# Patient Record
Sex: Male | Born: 1975 | Race: White | Hispanic: No | Marital: Single
Health system: Southern US, Community
[De-identification: ages and names within clinical notes are randomized; demographics above are authoritative.]

---

## 2012-12-31 ENCOUNTER — Ambulatory Visit: Payer: Self-pay | Admitting: Emergency Medicine

## 2013-01-02 ENCOUNTER — Ambulatory Visit: Payer: Self-pay | Admitting: Family Medicine

## 2013-01-04 ENCOUNTER — Ambulatory Visit: Payer: Self-pay | Admitting: Family Medicine

## 2013-01-06 ENCOUNTER — Ambulatory Visit: Payer: Self-pay | Admitting: Physician Assistant

## 2013-01-06 LAB — WOUND CULTURE

## 2014-08-06 ENCOUNTER — Ambulatory Visit: Payer: Self-pay | Admitting: Anesthesiology

## 2014-08-06 LAB — BASIC METABOLIC PANEL
Anion Gap: 5 — ABNORMAL LOW (ref 7–16)
BUN: 12 mg/dL (ref 7–18)
CO2: 28 mmol/L (ref 21–32)
CREATININE: 0.73 mg/dL (ref 0.60–1.30)
Calcium, Total: 9.5 mg/dL (ref 8.5–10.1)
Chloride: 98 mmol/L (ref 98–107)
EGFR (Non-African Amer.): >60
GLUCOSE: 340 mg/dL — AB (ref 65–99)
Osmolality: 276 (ref 275–301)
POTASSIUM: 4.1 mmol/L (ref 3.5–5.1)
SODIUM: 131 mmol/L — AB (ref 136–145)

## 2014-08-07 ENCOUNTER — Ambulatory Visit: Payer: Self-pay | Admitting: Orthopedic Surgery

## 2015-03-28 NOTE — Op Note (Signed)
PATIENT NAME:  Keith Roach, Dontarious M MR#:  [id number] DATE OF BIRTH:  1976-11-30  DATE OF PROCEDURE:  08/07/2014  PREOPERATIVE DIAGNOSIS: Displaced left distal radius fracture.   POSTOPERATIVE DIAGNOSIS:  Displaced left distal radius fracture.   PROCEDURE: Open reduction and internal fixation left distal radius.   ANESTHESIA: General.   SURGEON: Kennedy BuckerMichael Brailee Riede, M.D.   DESCRIPTION OF PROCEDURE: The patient was brought to the operating room and after adequate general anesthesia was obtained, the left arm was prepped and draped in the usual sterile fashion. Appropriate patient identification and timeout procedures were completed; fingertrap traction was applied to the index and middle fingers. Traction over the end of the bed was 9-3/4 pounds.  The tourniquet was raised and a volar approach was made over the FCR tendon. The tendon sheath was incised and the tendon retracted radially. Deep sheath was incised and the pronator identified and detached off its radial border. The fracture was displaced posteriorly and with traction and the use of a Freer elevator the fracture could be brought anterior near anatomic position.  It quite unstable though. A Crosslock plate from [employer] was then applied to the volar surface standard width distally, short plate in length. After applying it in the appropriate position and placing a K wire, the distal screw holes were filled with a distal first technique.  After the distal smooth pegs were placed, the plate was brought down to the shaft and this gave near anatomic alignment of the distal radius restoring some volar tilt, radial inclination, and length. Two of the shaft screws were placed using non-locking screws and then two of the Crosslink screws drilling and then placing the locking screws to aid in maintenance of the plate position was performed and under mini C-arm views plate position appeared appropriate.  One of the distal smooth pegs appeared to be protruding dorsally  and was removed and that hole left empty on the ulnar side of the plate.  The traction was removed, and, under fluoroscopic exam, the fracture was stable. At this point, the tourniquet was let down. There was no significant bleeding. The wound was irrigated and closed with 3-0 Vicryl subcutaneously and 4-0 nylon for the skin. Xeroform, 4 x 4's, Webril and a volar splint were applied followed by an Ace wrap. Tourniquet time was 39 minutes at 250 mmHg.   COMPLICATIONS: There were no complications.   SPECIMEN: No specimen.   IMPLANT:  [employer] Crosslink volar locking plate.     ____________________________ Leitha SchullerMichael J. Chevis Weisensel, MD mjm:nr D: 08/07/2014 20:01:50 ET T: 08/07/2014 20:56:52 ET JOB#: [id number]  cc: Leitha SchullerMichael J. Pierra Skora, MD, <Dictator> Leitha SchullerMICHAEL J Lucresia Simic MD ELECTRONICALLY SIGNED 08/08/2014 8:09

## 2018-12-27 ENCOUNTER — Encounter: Payer: 59 | Attending: Physician Assistant | Admitting: Physician Assistant

## 2018-12-27 DIAGNOSIS — E11621 Type 2 diabetes mellitus with foot ulcer: Secondary | ICD-10-CM | POA: Diagnosis not present

## 2018-12-27 DIAGNOSIS — F17200 Nicotine dependence, unspecified, uncomplicated: Secondary | ICD-10-CM | POA: Diagnosis not present

## 2018-12-27 DIAGNOSIS — L97512 Non-pressure chronic ulcer of other part of right foot with fat layer exposed: Secondary | ICD-10-CM | POA: Insufficient documentation

## 2018-12-27 DIAGNOSIS — E1143 Type 2 diabetes mellitus with diabetic autonomic (poly)neuropathy: Secondary | ICD-10-CM | POA: Diagnosis not present

## 2018-12-27 DIAGNOSIS — I10 Essential (primary) hypertension: Secondary | ICD-10-CM | POA: Diagnosis not present

## 2018-12-28 ENCOUNTER — Other Ambulatory Visit
Admission: RE | Admit: 2018-12-28 | Discharge: 2018-12-28 | Disposition: A | Discharge status: Home / Self Care | Payer: 59 | Source: Ambulatory Visit | Attending: Physician Assistant | Admitting: Physician Assistant

## 2018-12-28 DIAGNOSIS — E11621 Type 2 diabetes mellitus with foot ulcer: Secondary | ICD-10-CM | POA: Insufficient documentation

## 2018-12-28 DIAGNOSIS — L97512 Non-pressure chronic ulcer of other part of right foot with fat layer exposed: Secondary | ICD-10-CM | POA: Diagnosis not present

## 2018-12-28 NOTE — Progress Notes (Signed)
Keith Roach, Greogory M. ([id number]) Visit Report for 12/27/2018 Abuse/Suicide Risk Screen Details Patient Name: Keith Roach, Keith M. Date of Service: 12/27/2018 2:45 PM Medical Record Number: [id number] Patient Account Number: [id number] Date of Birth/Sex: 04/23/1976 [age] y.o. Male) Treating RN: Huel CoventryWoody, Kim Primary Care Gizell Danser: SYSTEM, PCP Other Clinician: Referring Miyoko Hashimi: Referral, Self Treating Anshika Pethtel/Extender: STONE III, HOYT Weeks in Treatment: 0 Abuse/Suicide Risk Screen Items Answer ABUSE/SUICIDE RISK SCREEN: Has anyone close to you tried to hurt or harm you recentlyo No Do you feel uncomfortable with anyone in your familyo No Has anyone forced you do things that you didnot want to doo No Do you have any thoughts of harming yourselfo No Patient displays signs or symptoms of abuse and/or neglect. No Electronic Signature(s) Signed: 12/27/2018 5:43:44 PM By: Elliot GurneyWoody, BSN, RN, CWS, Kim RN, BSN Entered By: Elliot GurneyWoody, BSN, RN, CWS, Kim on 12/27/2018 15:05:40 Keith Roach, Keith M. ([id number]) -------------------------------------------------------------------------------- Activities of Daily Living Details Patient Name: Keith Roach, Keith M. Date of Service: 12/27/2018 2:45 PM Medical Record Number: [id number] Patient Account Number: [id number] Date of Birth/Sex: 04/23/1976 [age] y.o. Male) Treating RN: Huel CoventryWoody, Kim Primary Care Lenville Hibberd: SYSTEM, PCP Other Clinician: Referring Kerina Simoneau: Referral, Self Treating Cinch Ormond/Extender: STONE III, HOYT Weeks in Treatment: 0 Activities of Daily Living Items Answer Activities of Daily Living (Please select one for each item) Drive Automobile Completely Able Take Medications Completely Able Use Telephone Completely Able Care for Appearance Completely Able Use Toilet Completely Able Bath / Shower Completely Able Dress Self Completely Able Feed Self Completely Able Walk Completely Able Get In / Out Bed Completely Able Housework Completely Able Prepare Meals  Completely Able Handle Money Completely Able Shop for Self Completely Able Electronic Signature(s) Signed: 12/27/2018 5:43:44 PM By: Elliot GurneyWoody, BSN, RN, CWS, Kim RN, BSN Entered By: Elliot GurneyWoody, BSN, RN, CWS, Kim on 12/27/2018 15:05:50 Keith Roach, Keith M. ([id number]) -------------------------------------------------------------------------------- Education Assessment Details Patient Name: Keith Roach, Keith M. Date of Service: 12/27/2018 2:45 PM Medical Record Number: [id number] Patient Account Number: [id number] Date of Birth/Sex: 04/23/1976 [age] y.o. Male) Treating RN: Huel CoventryWoody, Kim Primary Care Maliek Schellhorn: SYSTEM, PCP Other Clinician: Referring Dezeray Puccio: Referral, Self Treating Mande Auvil/Extender: STONE III, HOYT Weeks in Treatment: 0 Learning Preferences/Education Level/Primary Language Learning Preference: Explanation, Demonstration Highest Education Level: Grade School Preferred Language: English Cognitive Barrier Assessment/Beliefs Language Barrier: No Translator Needed: No Memory Deficit: No Emotional Barrier: No Cultural/Religious Beliefs Affecting Medical Care: No Physical Barrier Assessment Impaired Vision: No Impaired Hearing: No Decreased Hand dexterity: No Knowledge/Comprehension Assessment Knowledge Level: High Comprehension Level: High Ability to understand written High instructions: Ability to understand verbal High instructions: Motivation Assessment Anxiety Level: Calm Cooperation: Cooperative Education Importance: Acknowledges Need Interest in Health Problems: Asks Questions Perception: Coherent Willingness to Engage in Self- High Management Activities: Readiness to Engage in Self- High Management Activities: Electronic Signature(s) Signed: 12/27/2018 5:43:44 PM By: Elliot GurneyWoody, BSN, RN, CWS, Kim RN, BSN Entered By: Elliot GurneyWoody, BSN, RN, CWS, Kim on 12/27/2018 15:06:15 Keith Roach, Keith M.  ([id number]) -------------------------------------------------------------------------------- Fall Risk Assessment Details Patient Name: Keith Roach, [name] Date of Service: 12/27/2018 2:45 PM Medical Record Number: [id number] Patient Account Number: [id number] Date of Birth/Sex: 04/23/1976 [age] y.o. Male) Treating RN: Huel CoventryWoody, Kim Primary Care Ura Hausen: SYSTEM, PCP Other Clinician: Referring Jago Carton: Referral, Self Treating Jackey Housey/Extender: STONE III, HOYT Weeks in Treatment: 0 Fall Risk Assessment Items Have you had 2 or more falls in the last 12 monthso 0 No Have you had any fall that resulted in injury in the last 12 monthso 0 No FALL RISK ASSESSMENT: History of falling - immediate or  within 3 months 0 No Secondary diagnosis 0 No Ambulatory aid None/bed rest/wheelchair/nurse 0 Yes Crutches/cane/walker 0 No Furniture 0 No IV Access/Saline Lock 0 No Gait/Training Normal/bed rest/immobile 0 Yes Weak 0 No Impaired 0 No Mental Status Oriented to own ability 0 Yes Electronic Signature(s) Signed: 12/27/2018 5:43:44 PM By: Elliot Gurney, BSN, RN, CWS, Kim RN, BSN Entered By: Elliot Gurney, BSN, RN, CWS, Kim on 12/27/2018 15:07:51 Keith Roach ([id number]) -------------------------------------------------------------------------------- Foot Assessment Details Patient Name: Keith Roach. Date of Service: 12/27/2018 2:45 PM Medical Record Number: [id number] Patient Account Number: [id number] Date of Birth/Sex: 12/18/1975 (43 y.o. Male) Treating RN: Huel Coventry Primary Care Jozey Janco: SYSTEM, PCP Other Clinician: Referring Vennessa Affinito: Referral, Self Treating Carolle Ishii/Extender: STONE III, HOYT Weeks in Treatment: 0 Foot Assessment Items Site Locations + = Sensation present, - = Sensation absent, C = Callus, U = Ulcer R = Redness, W = Warmth, M = Maceration, PU = Pre-ulcerative lesion F = Fissure, S = Swelling, D = Dryness Assessment Right: Left: Other Deformity: No No Prior Foot Ulcer: No No Prior  Amputation: No No Charcot Joint: No No Ambulatory Status: Ambulatory Without Help Gait: Steady Electronic Signature(s) Signed: 12/27/2018 5:43:44 PM By: Elliot Gurney, BSN, RN, CWS, Kim RN, BSN Entered By: Elliot Gurney, BSN, RN, CWS, Kim on 12/27/2018 15:08:31 Keith Roach ([id number]) -------------------------------------------------------------------------------- Nutrition Risk Assessment Details Patient Name: Keith Roach. Date of Service: 12/27/2018 2:45 PM Medical Record Number: [id number] Patient Account Number: [id number] Date of Birth/Sex: 1976/10/23 (43 y.o. Male) Treating RN: Huel Coventry Primary Care Amery Vandenbos: SYSTEM, PCP Other Clinician: Referring Lashone Stauber: Referral, Self Treating Alexus Michael/Extender: STONE III, HOYT Weeks in Treatment: 0 Height (in): 76 Weight (lbs): 300.4 Body Mass Index (BMI): 36.6 Nutrition Risk Assessment Items NUTRITION RISK SCREEN: I have an illness or condition that made me change the kind and/or amount of 0 No food I eat I eat fewer than two meals per day 0 No I eat few fruits and vegetables, or milk products 0 No I have three or more drinks of beer, liquor or wine almost every day 0 No I have tooth or mouth problems that make it hard for me to eat 0 No I don't always have enough money to buy the food I need 0 No I eat alone most of the time 0 No I take three or more different prescribed or over-the-counter drugs a day 1 Yes Without wanting to, I have lost or gained 10 pounds in the last six months 0 No I am not always physically able to shop, cook and/or feed myself 0 No Nutrition Protocols Good Risk Protocol 0 No interventions needed Moderate Risk Protocol Electronic Signature(s) Signed: 12/27/2018 5:43:44 PM By: Elliot Gurney, BSN, RN, CWS, Kim RN, BSN Entered By: Elliot Gurney, BSN, RN, CWS, Kim on 12/27/2018 15:07:55

## 2018-12-30 NOTE — Progress Notes (Signed)
[name], [name] ([id number]) Visit Report for 12/27/2018 Allergy List Details Patient Name: Keith Roach, Keith Roach. Date of Service: 12/27/2018 2:45 PM Medical Record Number: [id number] Patient Account Number: [id number] Date of Birth/Sex: [birth year] (43 y.o. Male) Treating RN: Cornell Barman Primary Care Marlowe Cinquemani: SYSTEM, PCP Other Clinician: Referring Mohanad Carsten: Referral, Self Treating Ryot Burrous/Extender: STONE III, HOYT Weeks in Treatment: 0 Allergies Active Allergies No Known Drug Allergies Allergy Notes Electronic Signature(s) Signed: 12/27/2018 5:43:44 PM By: Gretta Cool, BSN, RN, CWS, Kim RN, BSN Entered By: Gretta Cool, BSN, RN, CWS, Kim on 12/27/2018 14:56:52 Keith Roach ([id number]) -------------------------------------------------------------------------------- Arrival Information Details Patient Name: Keith Roach. Date of Service: 12/27/2018 2:45 PM Medical Record Number: [id number] Patient Account Number: [id number] Date of Birth/Sex: [birth year] (43 y.o. Male) Treating RN: Montey Hora Primary Care Siddiq Kaluzny: SYSTEM, PCP Other Clinician: Referring Bethania Schlotzhauer: Referral, Self Treating Shafer Swamy/Extender: STONE III, HOYT Weeks in Treatment: 0 Visit Information Patient Arrived: Ambulatory Arrival Time: 14:51 Accompanied By: self Transfer Assistance: None Patient Identification Verified: Yes Secondary Verification Process Completed: Yes Electronic Signature(s) Signed: 12/27/2018 4:32:03 PM By: Lorine Bears RCP, RRT, CHT Entered By: Lorine Bears on 12/27/2018 14:51:24 Keith Roach ([id number]) -------------------------------------------------------------------------------- Clinic Level of Care Assessment Details Patient Name: Keith Roach, Keith Roach. Date of Service: 12/27/2018 2:45 PM Medical Record Number: [id number] Patient Account Number: [id number] Date of Birth/Sex: [birth year] (43 y.o. Male) Treating RN: Montey Hora Primary Care Sojourner Behringer: SYSTEM, PCP Other  Clinician: Referring Cruz Bong: Referral, Self Treating Adelynne Joerger/Extender: STONE III, HOYT Weeks in Treatment: 0 Clinic Level of Care Assessment Items TOOL 1 Quantity Score _0  - Use when EandM and Procedure is performed on INITIAL visit 0 ASSESSMENTS - Nursing Assessment / Reassessment X - General Physical Exam (combine w/ comprehensive assessment (listed just below) when 1 20 performed on new pt. evals) X- 1 25 Comprehensive Assessment (HX, ROS, Risk Assessments, Wounds Hx, etc.) ASSESSMENTS - Wound and Skin Assessment / Reassessment _1  - Dermatologic / Skin Assessment (not related to wound area) 0 ASSESSMENTS - Ostomy and/or Continence Assessment and Care _2  - Incontinence Assessment and Management 0 _3  - 0 Ostomy Care Assessment and Management (repouching, etc.) PROCESS - Coordination of Care X - Simple Patient / Family Education for ongoing care 1 15 _4  - 0 Complex (extensive) Patient / Family Education for ongoing care X- 1 10 Staff obtains Programmer, systems, Records, Test Results / Process Orders _5  - 0 Staff telephones HHA, Nursing Homes / Clarify orders / etc _6  - 0 Routine Transfer to another Facility (non-emergent condition) _7  - 0 Routine Hospital Admission (non-emergent condition) X- 1 15 New Admissions / Biomedical engineer / Ordering NPWT, Apligraf, etc. _8  - 0 Emergency Hospital Admission (emergent condition) PROCESS - Special Needs _9  - Pediatric / Minor Patient Management 0 _10  - 0 Isolation Patient Management _11  - 0 Hearing / Language / Visual special needs _12  - 0 Assessment of Community assistance (transportation, D/C planning, etc.) _13  - 0 Additional assistance / Altered mentation _14  - 0 Support Surface(s) Assessment (bed, cushion, seat, etc.) Keith Roach, Keith Roach ([id number]) INTERVENTIONS - Miscellaneous _15  - External ear exam 0 _16  - 0 Patient Transfer (multiple staff / Civil Service fast streamer / Similar devices) _17  - 0 Simple Staple / Suture removal (25 or less) _18   - 0 Complex Staple / Suture removal (26 or more) _19  - 0 Hypo/Hyperglycemic Management (do not check if billed separately) X- 1 15 Ankle / Brachial Index (ABI) - do not check if billed separately Has the patient been seen at the hospital within the last  three years: Yes Total Score: 100 Level Of Care: New/Established - Level 3 Electronic Signature(s) Signed: 12/27/2018 5:09:04 PM By: Montey Hora Entered By: Montey Hora on 12/27/2018 15:55:46 Keith Roach ([id number]) -------------------------------------------------------------------------------- Encounter Discharge Information Details Patient Name: Keith Roach. Date of Service: 12/27/2018 2:45 PM Medical Record Number: [id number] Patient Account Number: [id number] Date of Birth/Sex: 15-May-1976 (43 y.o. Male) Treating RN: Montey Hora Primary Care Dainel Arcidiacono: SYSTEM, PCP Other Clinician: Referring Madeline Pho: Referral, Self Treating Kymoni Lesperance/Extender: STONE III, HOYT Weeks in Treatment: 0 Encounter Discharge Information Items Post Procedure Vitals Discharge Condition: Stable Temperature (F): 98.1 Ambulatory Status: Ambulatory Pulse (bpm): 89 Discharge Destination: Home Respiratory Rate (breaths/min): 16 Transportation: Private Auto Blood Pressure (mmHg): 191/82 Accompanied By: self Schedule Follow-up Appointment: Yes Clinical Summary of Care: Electronic Signature(s) Signed: 12/27/2018 4:56:17 PM By: Montey Hora Entered By: Montey Hora on 12/27/2018 16:56:17 Keith Roach ([id number]) -------------------------------------------------------------------------------- Lower Extremity Assessment Details Patient Name: Keith Roach. Date of Service: 12/27/2018 2:45 PM Medical Record Number: [id number] Patient Account Number: [id number] Date of Birth/Sex: [birth year] (43 y.o. Male) Treating RN: Cornell Barman Primary Care Ahmon Tosi: SYSTEM, PCP Other Clinician: Referring Ellieana Dolecki: Referral, Self Treating  Cruzita Lipa/Extender: STONE III, HOYT Weeks in Treatment: 0 Edema Assessment Assessed: [Left: No] [Right: No] Edema: [Left: No] [Right: No] Vascular Assessment Claudication: Claudication Assessment [Left:None] [Right:None] Pulses: Dorsalis Pedis Palpable: [Left:Yes] [Right:Yes] Doppler Audible: [Left:Yes] [Right:Yes] Posterior Tibial Palpable: [Left:Yes] [Right:Yes] Doppler Audible: [Right:Yes] Extremity colors, hair growth, and conditions: Extremity Color: [Left:Normal] [Right:Normal] Hair Growth on Extremity: [Left:Yes] [Right:Yes] Temperature of Extremity: [Left:Cool] [Right:Cool] Capillary Refill: [Left:< 3 seconds] [Right:< 3 seconds] Blood Pressure: Brachial: [Right:180] Dorsalis Pedis: [Left:Dorsalis Pedis:] Ankle: Posterior Tibial: 200 [Left:Posterior Tibial: 1.11] Toe Nail Assessment Left: Right: Thick: Yes Discolored: Yes Deformed: Yes Improper Length and Hygiene: Yes Notes 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 Patient is non-compressible >220 on right side. Electronic Signature(s) Signed: 12/27/2018 5:43:44 PM By: Gretta Cool, BSN, RN, CWS, Kim RN, BSN Entered By: Gretta Cool, BSN, RN, CWS, Kim on 12/27/2018 15:18:21 Keith Roach ([id number]) -------------------------------------------------------------------------------- Multi Wound Chart Details Patient Name: Keith Roach, Keith Roach. Date of Service: 12/27/2018 2:45 PM Medical Record Number: [id number] Patient Account Number: [id number] Date of Birth/Sex: [birth year] (43 y.o. Male) Treating RN: Montey Hora Primary Care Sari Cogan: SYSTEM, PCP Other Clinician: Referring Zariana Strub: Referral, Self Treating Camren Lipsett/Extender: STONE III, HOYT Weeks in Treatment: 0 Vital Signs Height(in): 76 Pulse(bpm): 59 Weight(lbs): 300.4 Blood Pressure(mmHg): 191/82 Body Mass Index(BMI): 37 Temperature(F): 98.1 Respiratory Rate 16 (breaths/min): Photos: [1:No Photos] [N/A:N/A] Wound Location: [1:Right Toe Great]  [N/A:N/A] Wounding Event: [1:Gradually Appeared] [N/A:N/A] Primary Etiology: [1:Diabetic Wound/Ulcer of the Lower Extremity] [N/A:N/A] Comorbid History: [1:Hypertension, Type II Diabetes, Osteomyelitis, Neuropathy] [N/A:N/A] Date Acquired: [1:12/17/2018] [N/A:N/A] Weeks of Treatment: [1:0] [N/A:N/A] Wound Status: [1:Open] [N/A:N/A] Pending Amputation on [1:Yes] [N/A:N/A] Presentation: Measurements L x W x D [1:0.8x0.9x1.2] [N/A:N/A] (cm) Area (cm) : [1:0.565] [N/A:N/A] Volume (cm) : [1:0.679] [N/A:N/A] % Reduction in Area: [1:0.00%] [N/A:N/A] % Reduction in Volume: [1:0.00%] [N/A:N/A] Starting Position 1 [1:12] (o'clock): Ending Position 1 [1:12] (o'clock): Maximum Distance 1 (cm): [1:0.9] Undermining: [1:Yes] [N/A:N/A] Classification: [1:Grade 2] [N/A:N/A] Exudate Amount: [1:Medium] [N/A:N/A] Exudate Type: [1:Sanguinous] [N/A:N/A] Exudate Color: [1:red] [N/A:N/A] Wound Margin: [1:Thickened] [N/A:N/A] Granulation Amount: [1:None Present (0%)] [N/A:N/A] Necrotic Amount: [1:Large (67-100%)] [N/A:N/A] Necrotic Tissue: [1:Eschar, Adherent Slough] [N/A:N/A] Exposed Structures: [1:Fat Layer (Subcutaneous Tissue) Exposed: Yes Fascia: No Tendon: No] [N/A:N/A] Muscle: No Joint: No Bone: No Epithelialization: None N/A N/A Periwound Skin Texture: Callus: Yes N/A N/A Excoriation: No Induration: No Crepitus: No Rash:  No Scarring: No Periwound Skin Moisture: Dry/Scaly: Yes N/A N/A Maceration: No Periwound Skin Color: Atrophie Blanche: No N/A N/A Cyanosis: No Ecchymosis: No Erythema: No Hemosiderin Staining: No Mottled: No Pallor: No Rubor: No Temperature: No Abnormality N/A N/A Tenderness on Palpation: No N/A N/A Wound Preparation: Topical Anesthetic Applied: N/A N/A None Treatment Notes Electronic Signature(s) Signed: 12/27/2018 5:09:04 PM By: Montey Hora Entered By: Montey Hora on 12/27/2018 15:49:18 Keith Roach  ([id number]) -------------------------------------------------------------------------------- [place] Details Patient Name: Keith Roach. Date of Service: 12/27/2018 2:45 PM Medical Record Number: [id number] Patient Account Number: [id number] Date of Birth/Sex: 07-20-1976 (43 y.o. Male) Treating RN: Montey Hora Primary Care Dorathea Faerber: SYSTEM, PCP Other Clinician: Referring Savan Ruta: Referral, Self Treating Gerald Kuehl/Extender: STONE III, HOYT Weeks in Treatment: 0 Active Inactive Abuse / Safety / Falls / Self Care Management Nursing Diagnoses: Potential for falls Goals: Patient will remain injury free related to falls Date Initiated: 12/27/2018 Target Resolution Date: 03/09/2019 Goal Status: Active Interventions: Assess fall risk on admission and as needed Notes: Necrotic Tissue Nursing Diagnoses: Impaired tissue integrity related to necrotic/devitalized tissue Goals: Necrotic/devitalized tissue will be minimized in the wound bed Date Initiated: 12/27/2018 Target Resolution Date: 03/09/2019 Goal Status: Active Interventions: Assess patient pain level pre-, during and post procedure and prior to discharge Notes: Orientation to the Wound Care Program Nursing Diagnoses: Knowledge deficit related to the wound healing center program Goals: Patient/caregiver will verbalize understanding of the [place] Program Date Initiated: 12/27/2018 Target Resolution Date: 03/09/2019 Goal Status: Active Interventions: Provide education on orientation to the wound center [place]qu[place], Juleen China M. ([id number]) Notes: Wound/Skin Impairment Nursing Diagnoses: Impaired tissue integrity Goals: Ulcer/skin breakdown will heal within 14 weeks Date Initiated: 12/27/2018 Target Resolution Date: 03/09/2019 Goal Status: Active Interventions: Assess patient/caregiver ability to obtain necessary supplies Assess patient/caregiver ability to perform ulcer/skin care regimen upon  admission and as needed Assess ulceration(s) every visit Notes: Electronic Signature(s) Signed: 12/27/2018 5:09:04 PM By: Montey Hora Entered By: Montey Hora on 12/27/2018 15:49:08 Keith Roach ([id number]) -------------------------------------------------------------------------------- Pain Assessment Details Patient Name: Keith Roach. Date of Service: 12/27/2018 2:45 PM Medical Record Number: [id number] Patient Account Number: [id number] Date of Birth/Sex: 03/05/1976 (43 y.o. Male) Treating RN: Montey Hora Primary Care Joden Bonsall: SYSTEM, PCP Other Clinician: Referring Obi Scrima: Referral, Self Treating Shalini Mair/Extender: STONE III, HOYT Weeks in Treatment: 0 Active Problems Location of Pain Severity and Description of Pain Patient Has Paino No Site Locations Pain Management and Medication Current Pain Management: Electronic Signature(s) Signed: 12/27/2018 4:32:03 PM By: Lorine Bears RCP, RRT, CHT Signed: 12/27/2018 5:09:04 PM By: Montey Hora Entered By: Lorine Bears on 12/27/2018 14:51:35 Keith Roach ([id number]) -------------------------------------------------------------------------------- Patient/Caregiver Education Details Patient Name: Keith Roach. Date of Service: 12/27/2018 2:45 PM Medical Record Number: [id number] Patient Account Number: [id number] Date of Birth/Gender: 23-Sep-1976 (43 y.o. Male) Treating RN: Montey Hora Primary Care Physician: SYSTEM, PCP Other Clinician: Referring Physician: Referral, Self Treating Physician/Extender: Melburn Hake, HOYT Weeks in Treatment: 0 Education Assessment Education Provided To: Patient Education Topics Provided Wound/Skin Impairment: Handouts: Other: wound care as ordered Methods: Demonstration, Explain/Verbal Responses: State content correctly Electronic Signature(s) Signed: 12/27/2018 5:09:04 PM By: Montey Hora Entered By: Montey Hora on 12/27/2018  15:56:07 Keith Roach ([id number]) -------------------------------------------------------------------------------- Wound Assessment Details Patient Name: Keith Roach. Date of Service: 12/27/2018 2:45 PM Medical Record Number: [id number] Patient Account Number: [id number] Date of Birth/Sex: 1976-11-02 (43 y.o. Male) Treating RN: Cornell Barman Primary Care Tymarion Everard: SYSTEM, PCP Other Clinician: Referring Rasul Decola: Referral, Self  Treating Lauri Purdum/Extender: STONE III, HOYT Weeks in Treatment: 0 Wound Status Wound Number: 1 Primary Diabetic Wound/Ulcer of the Lower Extremity Etiology: Wound Location: Right Toe Great Wound Status: Open Wounding Event: Gradually Appeared Comorbid Hypertension, Type II Diabetes, Date Acquired: 12/17/2018 History: Osteomyelitis, Neuropathy Weeks Of Treatment: 0 Clustered Wound: No Pending Amputation On Presentation Photos Wound Measurements Length: (cm) 0.8 % Reduction i Width: (cm) 0.9 % Reduction i Depth: (cm) 1.2 Epithelializa Area: (cm) 0.565 Tunneling: Volume: (cm) 0.679 Undermining: Starting P Ending Pos Maximum Di n Area: 0% n Volume: 0% tion: None No Yes osition (o'clock): 12 ition (o'clock): 12 stance: (cm) 0.9 Wound Description Classification: Grade 2 Foul Odor Aft Wound Margin: Thickened Slough/Fibrin Exudate Amount: Medium Exudate Type: Sanguinous Exudate Color: red er Cleansing: No o Yes Wound Bed Granulation Amount: None Present (0%) Exposed Structure Necrotic Amount: Large (67-100%) Fascia Exposed: No Necrotic Quality: Eschar, Adherent Slough Fat Layer (Subcutaneous Tissue) Exposed: Yes Tendon Exposed: No Muscle Exposed: No Keith Roach, Keith Roach ([id number]) Joint Exposed: No Bone Exposed: No Periwound Skin Texture Texture Color No Abnormalities Noted: No No Abnormalities Noted: No Callus: Yes Atrophie Blanche: No Crepitus: No Cyanosis: No Excoriation: No Ecchymosis: No Induration: No Erythema: No Rash:  No Hemosiderin Staining: No Scarring: No Mottled: No Pallor: No Moisture Rubor: No No Abnormalities Noted: No Dry / Scaly: Yes Temperature / Pain Maceration: No Temperature: No Abnormality Wound Preparation Topical Anesthetic Applied: None Treatment Notes Wound #1 (Right Toe Great) Notes silvercel, foam and conform Electronic Signature(s) Signed: 12/27/2018 5:28:05 PM By: Gretta Cool, BSN, RN, CWS, Kim RN, BSN Entered By: Gretta Cool, BSN, RN, CWS, Kim on 12/27/2018 17:28:05 Keith Roach ([id number]) -------------------------------------------------------------------------------- Vitals Details Patient Name: Keith Roach. Date of Service: 12/27/2018 2:45 PM Medical Record Number: [id number] Patient Account Number: [id number] Date of Birth/Sex: August 29, 1976 (43 y.o. Male) Treating RN: Montey Hora Primary Care Solash Tullo: SYSTEM, PCP Other Clinician: Referring Kimerly Rowand: Referral, Self Treating Inice Sanluis/Extender: STONE III, HOYT Weeks in Treatment: 0 Vital Signs Time Taken: 14:51 Temperature (F): 98.1 Height (in): 76 Pulse (bpm): 89 Source: Stated Respiratory Rate (breaths/min): 16 Weight (lbs): 300.4 Blood Pressure (mmHg): 191/82 Source: Measured Reference Range: 80 - 120 mg / dl Body Mass Index (BMI): 36.6 Airway Electronic Signature(s) Signed: 12/27/2018 4:32:03 PM By: Lorine Bears RCP, RRT, CHT Entered By: Lorine Bears on 12/27/2018 14:54:40

## 2018-12-30 NOTE — Progress Notes (Signed)
CLOUD, GRAHAM ([id number]) Visit Report for 12/27/2018 Chief Complaint Document Details Patient Name: Keith Roach, Keith Roach. Date of Service: 12/27/2018 2:45 PM Medical Record Number: [id number] Patient Account Number: [id number] Date of Birth/Sex: 1976/10/23 (43 y.o. Male) Treating RN: Curtis Sites Primary Care Provider: SYSTEM, PCP Other Clinician: Referring Provider: Referral, Self Treating Provider/Extender: STONE III, HOYT Weeks in Treatment: 0 Information Obtained from: Patient Chief Complaint Right foot ulcer Electronic Signature(s) Signed: 12/29/2018 9:42:16 AM By: Lenda Kelp PA-C Entered By: Lenda Kelp on 12/27/2018 15:21:02 Keith Roach ([id number]) -------------------------------------------------------------------------------- Debridement Details Patient Name: Keith Roach. Date of Service: 12/27/2018 2:45 PM Medical Record Number: [id number] Patient Account Number: [id number] Date of Birth/Sex: Aug 27, 1976 (43 y.o. Male) Treating RN: Huel Coventry Primary Care Provider: SYSTEM, PCP Other Clinician: Referring Provider: Referral, Self Treating Provider/Extender: STONE III, HOYT Weeks in Treatment: 0 Debridement Performed for Wound #1 Right Toe Great Assessment: Performed By: Physician STONE III, HOYT E., PA-C Debridement Type: Debridement Severity of Tissue Pre Bone involvement without necrosis Debridement: Level of Consciousness (Pre- Awake and Alert procedure): Pre-procedure Verification/Time Yes - 15:50 Out Taken: Start Time: 15:50 Pain Control: Lidocaine Total Area Debrided (L x W): 0.8 (cm) x 0.9 (cm) = 0.72 (cm) Tissue and other material Viable, Non-Viable, Blood Clots, Callus, Slough, Subcutaneous, Slough debrided: Level: Skin/Subcutaneous Tissue Debridement Description: Excisional Instrument: Curette Specimen: Swab, Number of Specimens Taken: 1 Bleeding: Moderate Hemostasis Achieved: Pressure End Time: 16:01 Procedural Pain:  Insensate Post Procedural Pain: Insensate Response to Treatment: Procedure was tolerated well Level of Consciousness Awake and Alert (Post-procedure): Post Debridement Measurements of Total Wound Length: (cm) 1 Width: (cm) 0.9 Depth: (cm) 1.2 Volume: (cm) 0.848 Character of Wound/Ulcer Post Debridement: Stable Severity of Tissue Post Debridement: Bone involvement without necrosis Post Procedure Diagnosis Same as Pre-procedure Electronic Signature(s) Signed: 12/27/2018 5:43:44 PM By: Elliot Gurney, BSN, RN, CWS, Kim RN, BSN Signed: 12/29/2018 9:42:16 AM By: Lenda Kelp PA-C Entered By: Elliot Gurney, BSN, RN, CWS, Kim on 12/27/2018 16:08:20 WETZEL, MEESTER ([id number]) JAECE, DUCHARME Macclenny. ([id number]) -------------------------------------------------------------------------------- HPI Details Patient Name: Keith Roach. Date of Service: 12/27/2018 2:45 PM Medical Record Number: [id number] Patient Account Number: [id number] Date of Birth/Sex: 03-28-1976 (43 y.o. Male) Treating RN: Curtis Sites Primary Care Provider: SYSTEM, PCP Other Clinician: Referring Provider: Referral, Self Treating Provider/Extender: STONE III, HOYT Weeks in Treatment: 0 History of Present Illness HPI Description: 12/27/18 on evaluation today patient presents for initial inspection in our clinic during issues that he is having with his right great toe. 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 He is a current smoker and his blood sugar from between 150 and 170 although he does not have a recent A1c. He does have neuropathy and has no feeling in his feet. His ABI's today were noncompressible on the right 1.11 on the left. He does have a history of hypertension along with the type II diabetes. He tells me that in the beginning he felt like this was more of a blister over his toe where he had a callous. Subsequently in the past several days especially the past week it became  obvious that he was having bleeding from the site. He has not really going anywhere to have this evaluated he just been taking care of it for the most part on his own. With that being said no one has done the x-rays previously seen podiatry. No fevers, chills, nausea, or vomiting noted at this time. He does  have a significant amount of callous buildup over the right first toe. Electronic Signature(s) Signed: 12/29/2018 9:42:16 AM By: Lenda Kelp PA-C Entered By: Lenda Kelp on 12/29/2018 09:38:23 Keith Roach ([id number]) -------------------------------------------------------------------------------- Physical Exam Details Patient Name: Keith Roach. Date of Service: 12/27/2018 2:45 PM Medical Record Number: [id number] Patient Account Number: [id number] Date of Birth/Sex: [birth year] (43 y.o. Male) Treating RN: Curtis Sites Primary Care Provider: SYSTEM, PCP Other Clinician: Referring Provider: Referral, Self Treating Provider/Extender: STONE III, HOYT Weeks in Treatment: 0 Constitutional patient is hypertensive.. pulse regular and within target range for patient.Marland Kitchen respirations regular, non-labored and within target range for patient.Marland Kitchen temperature within target range for patient.. Well-nourished and well-hydrated in no acute distress. Eyes conjunctiva clear no eyelid edema noted. pupils equal round and reactive to light and accommodation. Ears, Nose, Mouth, and Throat no gross abnormality of ear auricles or external auditory canals. normal hearing noted during conversation. mucus membranes moist. Respiratory normal breathing without difficulty. clear to auscultation bilaterally. Cardiovascular regular rate and rhythm with normal S1, S2. 2+ dorsalis pedis/posterior tibialis pulses. no clubbing, cyanosis, significant edema, <3 sec cap refill. Gastrointestinal (GI) soft, non-tender, non-distended, +BS. no ventral hernia noted. Musculoskeletal normal gait and posture. no  significant deformity or arthritic changes, no loss or range of motion, no clubbing. Psychiatric this patient is able to make decisions and demonstrates good insight into disease process. Alert and Oriented x 3. pleasant and cooperative. Notes On evaluation today patient actually appears to be doing well except for the fact that his right great toe is significantly calloused with a lot of buildup surrounding the actual wound location. With that being said he does not have any discomfort in this area did require sharp debridement today. Post debridement I was actually able to better define where the wound was. I did not find any specific region where I can obtain bone pathology or culture although this does seem to proceed fairly deeply. Again the concern still is osteomyelitis especially since the wound seems to be adjacent to even if it is not directly tunneling to the bone. Electronic Signature(s) Signed: 12/29/2018 9:42:16 AM By: Lenda Kelp PA-C Entered By: Lenda Kelp on 12/29/2018 09:40:00 Keith Roach ([id number]) -------------------------------------------------------------------------------- Physician Orders Details Patient Name: KOLBIE, LEPKOWSKI. Date of Service: 12/27/2018 2:45 PM Medical Record Number: [id number] Patient Account Number: [id number] Date of Birth/Sex: [birth year] (43 y.o. Male) Treating RN: Curtis Sites Primary Care Provider: SYSTEM, PCP Other Clinician: Referring Provider: Referral, Self Treating Provider/Extender: STONE III, HOYT Weeks in Treatment: 0 Verbal / Phone Orders: No Diagnosis Coding ICD-10 Coding Code Description E11.621 Type 2 diabetes mellitus with foot ulcer L97.512 Non-pressure chronic ulcer of other part of right foot with fat layer exposed I10 Essential (primary) hypertension E11.43 Type 2 diabetes mellitus with diabetic autonomic (poly)neuropathy Wound Cleansing Wound #1 Right Toe Great o Clean wound with Normal Saline. o  May shower with protection. Anesthetic (add to Medication List) Wound #1 Right Toe Great o Topical Lidocaine 4% cream applied to wound bed prior to debridement (In Clinic Only). Primary Wound Dressing Wound #1 Right Toe Great o Silver Alginate Secondary Dressing Wound #1 Right Toe Great o Conform/Kerlix o Foam Dressing Change Frequency Wound #1 Right Toe Great o Change dressing every other Roach. Follow-up Appointments Wound #1 Right Toe Great o Return Appointment in 1 week. Laboratory o Bacteria identified in Wound by Culture (MICRO) oooo LOINC Code: [id number] oooo Convenience Name: Wound culture routine KIPPER, BUCH. ([id number]) Radiology o  X-ray, foot Patient Medications Allergies: No Known Drug Allergies Notifications Medication Indication Start End doxycycline hyclate 12/27/2018 DOSE 1 - oral 100 mg capsule - 1 capsule oral taken 2 times a Roach for 14 days Electronic Signature(s) Signed: 12/27/2018 4:04:10 PM By: Lenda Kelp PA-C Entered By: Lenda Kelp on 12/27/2018 16:04:10 Keith Roach ([id number]) -------------------------------------------------------------------------------- Problem List Details Patient Name: Keith Roach. Date of Service: 12/27/2018 2:45 PM Medical Record Number: [id number] Patient Account Number: [id number] Date of Birth/Sex: 1976/09/05 (43 y.o. Male) Treating RN: Curtis Sites Primary Care Provider: SYSTEM, PCP Other Clinician: Referring Provider: Referral, Self Treating Provider/Extender: STONE III, HOYT Weeks in Treatment: 0 Active Problems ICD-10 Evaluated Encounter Code Description Active Date Today Diagnosis E11.621 Type 2 diabetes mellitus with foot ulcer 12/27/2018 No Yes L97.512 Non-pressure chronic ulcer of other part of right foot with fat 12/27/2018 No Yes layer exposed I10 Essential (primary) hypertension 12/27/2018 No Yes E11.43 Type 2 diabetes mellitus with diabetic autonomic (poly) 12/27/2018 No  Yes neuropathy Inactive Problems Resolved Problems Electronic Signature(s) Signed: 12/29/2018 9:42:16 AM By: Lenda Kelp PA-C Entered By: Lenda Kelp on 12/27/2018 15:22:26 Keith Roach ([id number]) -------------------------------------------------------------------------------- Progress Note Details Patient Name: Keith Roach. Date of Service: 12/27/2018 2:45 PM Medical Record Number: [id number] Patient Account Number: [id number] Date of Birth/Sex: 12-25-1975 (43 y.o. Male) Treating RN: Curtis Sites Primary Care Provider: SYSTEM, PCP Other Clinician: Referring Provider: Referral, Self Treating Provider/Extender: STONE III, HOYT Weeks in Treatment: 0 Subjective Chief Complaint Information obtained from Patient Right foot ulcer History of Present Illness (HPI) 12/27/18 on evaluation today patient presents for initial inspection in our clinic during issues that he is having with his right great toe. 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 He is a current smoker and his blood sugar from between 150 and 170 although he does not have a recent A1c. He does have neuropathy and has no feeling in his feet. His ABI's today were noncompressible on the right 1.11 on the left. He does have a history of hypertension along with the type II diabetes. He tells me that in the beginning he felt like this was more of a blister over his toe where he had a callous. Subsequently in the past several days especially the past week it became obvious that he was having bleeding from the site. He has not really going anywhere to have this evaluated he just been taking care of it for the most part on his own. With that being said no one has done the x-rays previously seen podiatry. No fevers, chills, nausea, or vomiting noted at this time. He does have a significant amount of callous buildup over the right first toe. Wound History Patient presents  with 1 open wound that has been present for approximately 12/24/2018. Laboratory tests have not been performed in the last month. Patient reportedly has not tested positive for an antibiotic resistant organism. Patient reportedly has tested positive for osteomyelitis. Patient reportedly has had testing performed to evaluate circulation in the legs. Patient experiences the following problems associated with their wounds: infection. Patient History Information obtained from Patient. Allergies No Known Drug Allergies Family History Diabetes - Father, Heart Disease - Father, Hypertension - Father, Kidney Disease - Father, No family history of Cancer, Lung Disease, Seizures, Stroke, Thyroid Problems, Tuberculosis. Social History Current every Roach smoker - 25 years, Marital Status - Married, Alcohol Use - Moderate, Drug Use - No History, Caffeine  Use - Never. Medical History Eyes Denies history of Cataracts Eyes Denies history of Glaucoma Eyes Denies history of Optic Neuritis Ear/Nose/Mouth/Throat Denies history of Chronic sinus problems/congestion VICTORINO, FATZINGER ([id number]) Ear/Nose/Mouth/Throat Denies history of Middle ear problems Hematologic/Lymphatic Denies history of Anemia Hematologic/Lymphatic Denies history of Hemophilia Hematologic/Lymphatic Denies history of Human Immunodeficiency Virus Hematologic/Lymphatic Denies history of Lymphedema Hematologic/Lymphatic Denies history of Sickle Cell Disease Respiratory Denies history of Aspiration Respiratory Denies history of Asthma Respiratory Denies history of Chronic Obstructive Pulmonary Disease (COPD) Respiratory Denies history of Pneumothorax Respiratory Denies history of Sleep Apnea Respiratory Denies history of Tuberculosis Cardiovascular Patient has history of Hypertension Cardiovascular Denies history of Angina Cardiovascular Denies history of Arrhythmia Cardiovascular Denies history of Congestive Heart  Failure Cardiovascular Denies history of Coronary Artery Disease Cardiovascular Denies history of Deep Vein Thrombosis Cardiovascular Denies history of Hypotension Cardiovascular Denies history of Myocardial Infarction Cardiovascular Denies history of Peripheral Arterial Disease Cardiovascular Denies history of Peripheral Venous Disease Cardiovascular Denies history of Phlebitis Cardiovascular Denies history of Vasculitis Gastrointestinal Denies history of Cirrhosis Gastrointestinal Denies history of Colitis Gastrointestinal Denies history of Crohn s Gastrointestinal Denies history of Hepatitis A Gastrointestinal Denies history of Hepatitis B Gastrointestinal Denies history of Hepatitis ANTHEM, FRAZER ([id number]) Endocrine Patient has history of Type II Diabetes Endocrine Denies history of Type I Diabetes Genitourinary Denies history of End Stage Renal Disease Immunological Denies history of Lupus Erythematosus Immunological Denies history of Raynaud s Immunological Denies history of Scleroderma Integumentary (Skin) Denies history of History of Burn Integumentary (Skin) Denies history of History of pressure wounds Musculoskeletal Patient has history of Osteomyelitis - Left Transmet Musculoskeletal Denies history of Gout Musculoskeletal Denies history of Rheumatoid Arthritis Musculoskeletal Denies history of Osteoarthritis Neurologic Patient has history of Neuropathy - Feet Neurologic Denies history of Dementia Neurologic Denies history of Quadriplegia Neurologic Denies history of Paraplegia Neurologic Denies history of Seizure Disorder Oncologic Denies history of Received Chemotherapy Oncologic Denies history of Received Radiation Psychiatric Denies history of Anorexia/bulimia Psychiatric Denies history of Confinement Anxiety Patient is treated with Oral Agents. Blood sugar is tested. Hospitalization/Surgery History - 03/05/2018, [facility],  transmet amputation. Review of Systems (ROS) Constitutional Symptoms (General Health) The patient has no complaints or symptoms. Eyes The patient has no complaints or symptoms. Ear/Nose/Mouth/Throat The patient has no complaints or symptoms. Hematologic/Lymphatic The patient has no complaints or symptoms. Respiratory The patient has no complaints or symptoms. Cardiovascular Complains or has symptoms of LE edema. JOYCE, LECKEY M. ([id number]) Denies complaints or symptoms of Chest pain. Gastrointestinal The patient has no complaints or symptoms. Endocrine Denies complaints or symptoms of Hepatitis, Thyroid disease, Polydypsia (Excessive Thirst). Genitourinary The patient has no complaints or symptoms. Immunological The patient has no complaints or symptoms. Integumentary (Skin) Complains or has symptoms of Wounds. Denies complaints or symptoms of Bleeding or bruising tendency, Breakdown, Swelling. Musculoskeletal The patient has no complaints or symptoms. Neurologic The patient has no complaints or symptoms. Oncologic The patient has no complaints or symptoms. Psychiatric The patient has no complaints or symptoms. Objective Constitutional patient is hypertensive.. pulse regular and within target range for patient.Marland Kitchen respirations regular, non-labored and within target range for patient.Marland Kitchen temperature within target range for patient.. Well-nourished and well-hydrated in no acute distress. Vitals Time Taken: 2:51 PM, Height: 76 in, Source: Stated, Weight: 300.4 lbs, Source: Measured, BMI: 36.6, Temperature: 98.1 F, Pulse: 89 bpm, Respiratory Rate: 16 breaths/min, Blood Pressure: 191/82 mmHg. Eyes conjunctiva clear no eyelid edema noted. pupils equal round and reactive to light and  accommodation. Ears, Nose, Mouth, and Throat no gross abnormality of ear auricles or external auditory canals. normal hearing noted during conversation. mucus membranes moist. Respiratory normal  breathing without difficulty. clear to auscultation bilaterally. Cardiovascular regular rate and rhythm with normal S1, S2. 2+ dorsalis pedis/posterior tibialis pulses. no clubbing, cyanosis, significant edema, Gastrointestinal (GI) soft, non-tender, non-distended, +BS. no ventral hernia noted. Musculoskeletal normal gait and posture. no significant deformity or arthritic changes, no loss or range of motion, no clubbing. Psychiatric KYMIR, COLES. ([id number]) this patient is able to make decisions and demonstrates good insight into disease process. Alert and Oriented x 3. pleasant and cooperative. General Notes: On evaluation today patient actually appears to be doing well except for the fact that his right great toe is significantly calloused with a lot of buildup surrounding the actual wound location. With that being said he does not have any discomfort in this area did require sharp debridement today. Post debridement I was actually able to better define where the wound was. I did not find any specific region where I can obtain bone pathology or culture although this does seem to proceed fairly deeply. Again the concern still is osteomyelitis especially since the wound seems to be adjacent to even if it is not directly tunneling to the bone. Integumentary (Hair, Skin) Wound #1 status is Open. Original cause of wound was Gradually Appeared. The wound is located on the Right Toe Great. The wound measures 0.8cm length x 0.9cm width x 1.2cm depth; 0.565cm^2 area and 0.679cm^3 volume. There is Fat Layer (Subcutaneous Tissue) Exposed exposed. There is no tunneling noted, however, there is undermining starting at 12:00 and ending at 12:00 with a maximum distance of 0.9cm. There is a medium amount of sanguinous drainage noted. The wound margin is thickened. There is no granulation within the wound bed. There is a large (67-100%) amount of necrotic tissue within the wound bed including Eschar and  Adherent Slough. The periwound skin appearance exhibited: Callus, Dry/Scaly. The periwound skin appearance did not exhibit: Crepitus, Excoriation, Induration, Rash, Scarring, Maceration, Atrophie Blanche, Cyanosis, Ecchymosis, Hemosiderin Staining, Mottled, Pallor, Rubor, Erythema. Periwound temperature was noted as No Abnormality. Assessment Active Problems ICD-10 Type 2 diabetes mellitus with foot ulcer Non-pressure chronic ulcer of other part of right foot with fat layer exposed Essential (primary) hypertension Type 2 diabetes mellitus with diabetic autonomic (poly)neuropathy Procedures Wound #1 Pre-procedure diagnosis of Wound #1 is a Diabetic Wound/Ulcer of the Lower Extremity located on the Right Toe Great .Severity of Tissue Pre Debridement is: Bone involvement without necrosis. There was a Excisional Skin/Subcutaneous Tissue Debridement with a total area of 0.72 sq cm performed by STONE III, HOYT E., PA-C. With the following instrument (s): Curette to remove Viable and Non-Viable tissue/material. Material removed includes Blood Clots, Callus, Subcutaneous Tissue, and Slough after achieving pain control using Lidocaine. 1 specimen was taken by a Swab and sent to the lab per facility protocol. A time out was conducted at 15:50, prior to the start of the procedure. A Moderate amount of bleeding was controlled with Pressure. The procedure was tolerated well with a pain level of Insensate throughout and a pain level of Insensate following the procedure. Post Debridement Measurements: 1cm length x 0.9cm width x 1.2cm depth; 0.848cm^3 volume. Character of Wound/Ulcer Post Debridement is stable. Severity of Tissue Post Debridement is: Bone involvement without necrosis. Post procedure Diagnosis Wound #1: Same as Pre-Procedure KAI, CALICO. ([id number]) Plan Wound Cleansing: Wound #1 Right Toe Great: Clean wound with Normal  Saline. May shower with protection. Anesthetic (add to  Medication List): Wound #1 Right Toe Great: Topical Lidocaine 4% cream applied to wound bed prior to debridement (In Clinic Only). Primary Wound Dressing: Wound #1 Right Toe Great: Silver Alginate Secondary Dressing: Wound #1 Right Toe Great: Conform/Kerlix Foam Dressing Change Frequency: Wound #1 Right Toe Great: Change dressing every other Roach. Follow-up Appointments: Wound #1 Right Toe Great: Return Appointment in 1 week. Laboratory ordered were: Wound culture routine Radiology ordered were: X-ray, foot The following medication(s) was prescribed: doxycycline hyclate oral 100 mg capsule 1 1 capsule oral taken 2 times a Roach for 14 days starting 12/27/2018 At this point my suggestion is gonna be that we go ahead and see about getting an x-ray of his foot with special attention to the toe in particular. He is in agreement with this plan. Subsequently I did perform a culture with scrapings from the base of the wound. Subsequently also placed him on doxycycline today as a emperic treatment to try and help with what appears to likely be an infection with the erythema that's noted in the great toe on the right. We will use silver alginate dressing's conceptually see him back to see were things stand in one weeks time. He's in agreement with this plan. If anything changes worsens the meantime he will let me know if you develop city fevers, chills, nausea, vomiting, diarrhea he knows to go to the ER ASAP due to the fact that he could be becoming septic. He's been down that road before and knows what to look for. Please see above for specific wound care orders. We will see patient for re-evaluation in 1 week(s) here in the clinic. If anything worsens or changes patient will contact our office for additional recommendations. Electronic Signature(s) Signed: 12/29/2018 9:42:16 AM By: Lenda KelpStone III, Hoyt PA-C Entered By: Lenda KelpStone III, Hoyt on 12/29/2018 09:41:32 Keith HeinzKANE, Severin M.  ([id number]) -------------------------------------------------------------------------------- ROS/PFSH Details Patient Name: Keith HeinzKANE, Ramar M. Date of Service: 12/27/2018 2:45 PM Medical Record Number: [id number] Patient Account Number: [id number] Date of Birth/Sex: 1976/03/03 [age] y.o. Male) Treating RN: Huel CoventryWoody, Kim Primary Care Provider: SYSTEM, PCP Other Clinician: Referring Provider: Referral, Self Treating Provider/Extender: STONE III, HOYT Weeks in Treatment: 0 Information Obtained From Patient Wound History Do you currently have one or more open woundso Yes How many open wounds do you currently haveo 1 Approximately how long have you had your woundso 12/24/2018 Has your wound(s) ever healed and then re-openedo No Have you had any lab work done in the past montho No Have you tested positive for an antibiotic resistant organism (MRSA, VRE)o No Have you tested positive for osteomyelitis (bone infection)o Yes Date: 03/05/2018 Have you had any tests for circulation on your legso Yes Where was the test [facility] [facility] Have you had other problems associated with your woundso Infection Cardiovascular Complaints and Symptoms: Positive for: LE edema Negative for: Chest pain Medical History: Positive for: Hypertension Negative for: Angina; Arrhythmia; Congestive Heart Failure; Coronary Artery Disease; Deep Vein Thrombosis; Hypotension; Myocardial Infarction; Peripheral Arterial Disease; Peripheral Venous Disease; Phlebitis; Vasculitis Endocrine Complaints and Symptoms: Negative for: Hepatitis; Thyroid disease; Polydypsia (Excessive Thirst) Medical History: Positive for: Type II Diabetes Negative for: Type I Diabetes Time with diabetes: 2015 Treated with: Oral agents Blood sugar tested every [place]: Yes Tested : 2 Integumentary (Skin) Complaints and Symptoms: Positive for: Wounds Negative for: Bleeding or bruising tendency; Breakdown; Swelling Medical History: Negative for:  History of Burn; History of pressure wounds Priola, Harding M. (  [id number]) Musculoskeletal Complaints and Symptoms: No Complaints or Symptoms Complaints and Symptoms: Negative for: Muscle Pain; Muscle Weakness Medical History: Positive for: Osteomyelitis - Left Transmet Negative for: Gout; Rheumatoid Arthritis; Osteoarthritis Neurologic Complaints and Symptoms: No Complaints or Symptoms Complaints and Symptoms: Negative for: Numbness/parasthesias; Focal/Weakness Medical History: Positive for: Neuropathy - Feet Negative for: Dementia; Quadriplegia; Paraplegia; Seizure Disorder Constitutional Symptoms (General Health) Complaints and Symptoms: No Complaints or Symptoms Eyes Complaints and Symptoms: No Complaints or Symptoms Medical History: Negative for: Cataracts; Glaucoma; Optic Neuritis Ear/Nose/Mouth/Throat Complaints and Symptoms: No Complaints or Symptoms Medical History: Negative for: Chronic sinus problems/congestion; Middle ear problems Hematologic/Lymphatic Complaints and Symptoms: No Complaints or Symptoms Medical History: Negative for: Anemia; Hemophilia; Human Immunodeficiency Virus; Lymphedema; Sickle Cell Disease Respiratory Complaints and Symptoms: No Complaints or Symptoms Medical History: Negative for: Aspiration; Asthma; Chronic Obstructive Pulmonary Disease (COPD); Pneumothorax; Sleep Apnea; Keith HeinzKANE, Severiano M. ([id number]) Tuberculosis Gastrointestinal Complaints and Symptoms: No Complaints or Symptoms Medical History: Negative for: Cirrhosis ; Colitis; Crohnos; Hepatitis A; Hepatitis B; Hepatitis C Genitourinary Complaints and Symptoms: No Complaints or Symptoms Medical History: Negative for: End Stage Renal Disease Immunological Complaints and Symptoms: No Complaints or Symptoms Medical History: Negative for: Lupus Erythematosus; Raynaudos; Scleroderma Oncologic Complaints and Symptoms: No Complaints or Symptoms Medical History: Negative  for: Received Chemotherapy; Received Radiation Psychiatric Complaints and Symptoms: No Complaints or Symptoms Medical History: Negative for: Anorexia/bulimia; Confinement Anxiety Immunizations Pneumococcal Vaccine: Received Pneumococcal Vaccination: No Implantable Devices Hospitalization / Surgery History Name of Hospital Purpose of Hospitalization/Surgery Date [facility] transmet amputation 03/05/2018 Family and Social History Cancer: No; Diabetes: Yes - Father; Heart Disease: Yes - Father; Hypertension: Yes - Father; Kidney Disease: Yes - Father; Lung Disease: No; Seizures: No; Stroke: No; Thyroid Problems: No; Tuberculosis: No; Current every [place] smoker - 25 years; Marital Status - Married; Alcohol Use: Moderate; Drug Use: No History; Caffeine Use: Never; Advanced Directives: No; Patient does not want information on Advanced Directives; Do not resuscitate: No; Living Will: No; Medical Power of Attorney: No Keith HeinzKANE, Demarius M. ([id number]) Electronic Signature(s) Signed: 12/27/2018 5:43:44 PM By: Elliot GurneyWoody, BSN, RN, CWS, Kim RN, BSN Signed: 12/29/2018 9:42:16 AM By: Lenda KelpStone III, Hoyt PA-C Entered By: Elliot GurneyWoody, BSN, RN, CWS, Kim on 12/27/2018 15:05:20 Keith HeinzKANE, Ferrell M. ([id number]) -------------------------------------------------------------------------------- SuperBill Details Patient Name: Keith HeinzKANE, Jayshun M. Date of Service: 12/27/2018 Medical Record Number: [id number] Patient Account Number: [id number] Date of Birth/Sex: [birth year] [age] y.o. 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 Facility Procedures CPT4 Code: [id number] Description: 99213 - WOUND CARE VISIT-LEV 3 EST PT Modifier: Quantity: 1 CPT4 Code: [id number] Description: 11042 - DEB SUBQ TISSUE 20 SQ CM/< ICD-10 Diagnosis Description L97.512 Non-pressure chronic ulcer of other part of right foot with fat Modifier: layer exposed Quantity: 1 Physician Procedures CPT4 Code: [id number] Description: 99204 - WC PHYS LEVEL 4 - NEW PT ICD-10 Diagnosis Description E11.621 Type 2 diabetes mellitus with foot ulcer L97.512 Non-pressure chronic ulcer of other part of right foot with fat I10 Essential (primary) hypertension E11.43 Type 2  diabetes mellitus with diabetic autonomic (poly)neuropat Modifier: 25 layer exposed hy Quantity: 1 CPT4 Code: [id number] Description: 11042 - WC PHYS SUBQ TISS 20 SQ CM ICD-10  Diagnosis Description L97.512 Non-pressure chronic ulcer of other part of right foot with fat Modifier: layer exposed Quantity: 1 Electronic Signature(s) Signed: 12/29/2018 9:42:16 AM By: Lenda Kelp PA-C Entered By: Lenda Kelp on 12/29/2018 09:41:53

## 2019-01-02 ENCOUNTER — Ambulatory Visit
Admission: RE | Admit: 2019-01-02 | Discharge: 2019-01-02 | Disposition: A | Discharge status: Home / Self Care | Payer: 59 | Source: Ambulatory Visit | Attending: Physician Assistant | Admitting: Physician Assistant

## 2019-01-02 ENCOUNTER — Other Ambulatory Visit: Payer: Self-pay | Admitting: Physician Assistant

## 2019-01-02 DIAGNOSIS — S91101A Unspecified open wound of right great toe without damage to nail, initial encounter: Secondary | ICD-10-CM | POA: Diagnosis present

## 2019-01-02 LAB — AEROBIC/ANAEROBIC CULTURE W GRAM STAIN (SURGICAL/DEEP WOUND)
Culture: NORMAL
Gram Stain: NONE SEEN

## 2019-01-03 ENCOUNTER — Encounter: Payer: 59 | Admitting: Physician Assistant

## 2019-01-03 DIAGNOSIS — E11621 Type 2 diabetes mellitus with foot ulcer: Secondary | ICD-10-CM | POA: Diagnosis not present

## 2019-01-04 ENCOUNTER — Other Ambulatory Visit: Payer: Self-pay | Admitting: Physician Assistant

## 2019-01-04 DIAGNOSIS — L97512 Non-pressure chronic ulcer of other part of right foot with fat layer exposed: Secondary | ICD-10-CM

## 2019-01-10 ENCOUNTER — Ambulatory Visit: Payer: 59 | Admitting: Physician Assistant

## 2019-01-15 ENCOUNTER — Encounter: Payer: 59 | Attending: Physician Assistant | Admitting: Physician Assistant

## 2019-01-15 DIAGNOSIS — E11621 Type 2 diabetes mellitus with foot ulcer: Secondary | ICD-10-CM | POA: Diagnosis not present

## 2019-01-15 DIAGNOSIS — L97512 Non-pressure chronic ulcer of other part of right foot with fat layer exposed: Secondary | ICD-10-CM | POA: Insufficient documentation

## 2019-01-15 DIAGNOSIS — E1143 Type 2 diabetes mellitus with diabetic autonomic (poly)neuropathy: Secondary | ICD-10-CM | POA: Insufficient documentation

## 2019-01-15 DIAGNOSIS — I10 Essential (primary) hypertension: Secondary | ICD-10-CM | POA: Insufficient documentation

## 2019-01-15 DIAGNOSIS — F17200 Nicotine dependence, unspecified, uncomplicated: Secondary | ICD-10-CM | POA: Insufficient documentation

## 2019-01-16 ENCOUNTER — Ambulatory Visit
Admission: RE | Admit: 2019-01-16 | Discharge: 2019-01-16 | Disposition: A | Discharge status: Home / Self Care | Payer: 59 | Source: Ambulatory Visit | Attending: Physician Assistant | Admitting: Physician Assistant

## 2019-01-16 DIAGNOSIS — L97512 Non-pressure chronic ulcer of other part of right foot with fat layer exposed: Secondary | ICD-10-CM

## 2019-01-16 LAB — POCT I-STAT CREATININE: CREATININE: 1.5 mg/dL — AB (ref 0.61–1.24)

## 2019-01-16 MED ORDER — GADOBUTROL 1 MMOL/ML IV SOLN
10.0000 mL | Freq: Once | INTRAVENOUS | Status: AC | PRN
  Administered 2019-01-16: 10 mL via INTRAVENOUS

## 2019-01-20 NOTE — Progress Notes (Signed)
LAYMOND, POSTLE ([id number]) Visit Report for 01/15/2019 Chief Complaint Document Details Patient Name: Keith Roach, Keith Roach. Date of Service: 01/15/2019 1:30 PM Medical Record Number: [id number] Patient Account Number: [id number] Date of Birth/Sex: [birth year] (43 y.o. M) Treating RN: Curtis Sites Primary Care Provider: Scarlett Presto Other Clinician: Referring Provider: Scarlett Presto Treating Provider/Extender: Linwood Dibbles, HOYT Weeks in Treatment: 2 Information Obtained from: Patient Chief Complaint Right foot ulcer Electronic Signature(s) Signed: 01/20/2019 7:15:48 AM By: Lenda Kelp PA-C Entered By: Lenda Kelp on 01/15/2019 13:32:42 Keith Roach ([id number]) -------------------------------------------------------------------------------- Debridement Details Patient Name: Keith Roach. Date of Service: 01/15/2019 1:30 PM Medical Record Number: [id number] Patient Account Number: [id number] Date of Birth/Sex: 1976/04/27 (43 y.o. M) Treating RN: Curtis Sites Primary Care Provider: Scarlett Presto Other Clinician: Referring Provider: Scarlett Presto Treating Provider/Extender: Linwood Dibbles, HOYT Weeks in Treatment: 2 Debridement Performed for Wound #1 Right Toe Great Assessment: Performed By: Physician STONE III, HOYT E., PA-C Debridement Type: Debridement Severity of Tissue Pre Necrosis of muscle Debridement: Level of Consciousness (Pre- Awake and Alert procedure): Pre-procedure Verification/Time Yes - 14:21 Out Taken: Start Time: 14:21 Pain Control: Lidocaine 4% Topical Solution Total Area Debrided (L x W): 1.2 (cm) x 0.7 (cm) = 0.84 (cm) Tissue and other material Viable, Non-Viable, Muscle, Slough, Subcutaneous, Slough debrided: Level: Skin/Subcutaneous Tissue/Muscle Debridement Description: Excisional Instrument: Curette Bleeding: Minimum Hemostasis Achieved: Pressure End Time: 14:27 Procedural Pain: 0 Post Procedural Pain: 0 Response to Treatment:  Procedure was tolerated well Level of Consciousness Awake and Alert (Post-procedure): Post Debridement Measurements of Total Wound Length: (cm) 1.2 Width: (cm) 0.7 Depth: (cm) 1.4 Volume: (cm) 0.924 Character of Wound/Ulcer Post Debridement: Improved Severity of Tissue Post Debridement: Necrosis of muscle Post Procedure Diagnosis Same as Pre-procedure Electronic Signature(s) Signed: 01/15/2019 4:52:44 PM By: Curtis Sites Signed: 01/20/2019 7:15:48 AM By: Lenda Kelp PA-C Entered By: Curtis Sites on 01/15/2019 14:26:36 Keith Roach ([id number]) -------------------------------------------------------------------------------- HPI Details Patient Name: Keith Roach. Date of Service: 01/15/2019 1:30 PM Medical Record Number: [id number] Patient Account Number: [id number] Date of Birth/Sex: [birth year] (43 y.o. M) Treating RN: Curtis Sites Primary Care Provider: Scarlett Presto Other Clinician: Referring Provider: Scarlett Presto Treating Provider/Extender: Linwood Dibbles, HOYT Weeks in Treatment: 2 History of Present Illness HPI Description: 12/27/18 on evaluation today patient presents for initial inspection in our clinic during issues that he is having with his right great toe. 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 He is a current smoker and his blood sugar from between 150 and 170 although he does not have a recent A1c. He does have neuropathy and has no feeling in his feet. His ABI's today were noncompressible on the right 1.11 on the left. He does have a history of hypertension along with the type II diabetes. He tells me that in the beginning he felt like this was more of a blister over his toe where he had a callous. Subsequently in the past several days especially the past week it became obvious that he was having bleeding from the site. He has not really going anywhere to have this evaluated he just been taking care  of it for the most part on his own. With that being said no one has done the x-rays previously seen podiatry. No fevers, chills, nausea, or vomiting noted at this time. He does have a significant amount of callous buildup over the right first toe. 01/03/19 on evaluation today patient appears to be  the doing okay in regard to his right great toe. We did have an x-ray performed which was negative for obvious signs of acute osteomyelitis. With that being said the tissue in the base of the wound still appears to be necrotic on evaluation today and there is bone exposed at the base of the wound. This has me concerned nonetheless with the possibility of active infection with osteomyelitis. This was discussed with the patient today. 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 Nonetheless it looks as if treatment with Levaquin may be a very good option for him at this point. The toe was still very erythematous today but necrotic tissue in the base of the wound and a malodorous smell to the wound bed. No fevers, chills, nausea, or vomiting noted at this time. 01/15/19 on evaluation today patient appears to be doing about the same in regard to his foot ulcer. This is on the right great toe. With that being said he states that the Iodoflex seems to make this much more went any somewhat concerned about that. Fortunately there's no signs of worsening infection although there still malodorous drainage noted at this point. No fevers, chills, nausea, or vomiting noted at this time. He has been taking the Augmentin. Electronic Signature(s) Signed: 01/20/2019 7:15:48 AM By: Lenda Kelp PA-C Entered By: Lenda Kelp on 01/15/2019 14:30:58 Keith Roach ([id number]) -------------------------------------------------------------------------------- Physical Exam Details Patient Name: Keith Roach, Keith Roach. Date of Service:  01/15/2019 1:30 PM Medical Record Number: [id number] Patient Account Number: [id number] Date of Birth/Sex: [birth year] (43 y.o. M) Treating RN: Curtis Sites Primary Care Provider: Scarlett Presto Other Clinician: Referring Provider: Scarlett Presto Treating Provider/Extender: STONE III, HOYT Weeks in Treatment: 2 Constitutional Well-nourished and well-hydrated in no acute distress. Respiratory normal breathing without difficulty. Psychiatric this patient is able to make decisions and demonstrates good insight into disease process. Alert and Oriented x 3. pleasant and cooperative. Notes Patient's wound bed currently shows signs of necrotic tissue including what appeared to be muscle as well in the wound bed. This required sharp debridement to clear away the callous as well is necrotic material from the base of the wound. This was performed fairly extensively post debridement the wound bed appears to be doing much better unfortunately there is still obviously drainage and odor noted from the area due to the necrotic tissue. Electronic Signature(s) Signed: 01/20/2019 7:15:48 AM By: Lenda Kelp PA-C Entered By: Lenda Kelp on 01/15/2019 14:31:33 Keith Roach ([id number]) -------------------------------------------------------------------------------- Physician Orders Details Patient Name: Keith Roach. Date of Service: 01/15/2019 1:30 PM Medical Record Number: [id number] Patient Account Number: [id number] Date of Birth/Sex: March 01, 1976 (43 y.o. M) Treating RN: Curtis Sites Primary Care Provider: Scarlett Presto Other Clinician: Referring Provider: Scarlett Presto Treating Provider/Extender: Linwood Dibbles, HOYT Weeks in Treatment: 2 Verbal / Phone Orders: No Diagnosis Coding ICD-10 Coding Code Description E11.621 Type 2 diabetes mellitus with foot ulcer L97.512 Non-pressure chronic ulcer of other part of right foot with fat layer exposed I10 Essential (primary)  hypertension E11.43 Type 2 diabetes mellitus with diabetic autonomic (poly)neuropathy Wound Cleansing Wound #1 Right Toe Great o Clean wound with Normal Saline. o May shower with protection. Anesthetic (add to Medication List) Wound #1 Right Toe Great o Topical Lidocaine 4% cream applied to wound bed prior to debridement (In Clinic Only). Primary Wound Dressing Wound #1 Right Toe Great o Gentamicin Sulfate Cream - to wound bed o Silver Alginate -  over gentamicin cream Secondary Dressing Wound #1 Right Toe Great o Conform/Kerlix o Foam Dressing Change Frequency Wound #1 Right Toe Great o Change dressing every other day. Follow-up Appointments Wound #1 Right Toe Great o Return Appointment in 1 week. Patient Medications Allergies: No Known Drug Allergies Notifications Medication Indication Start End gentamicin 01/15/2019 DOSE topical 0.1 % cream - cream topical applied to the wound with each dressing change as directed. Keith Roach, Sheppard M. ([id number]) Electronic Signature(s) Signed: 01/15/2019 2:33:30 PM By: Lenda KelpStone III, Hoyt PA-C Entered By: Lenda KelpStone III, Hoyt on 01/15/2019 14:33:30 Keith Roach, Keith M. ([id number]) -------------------------------------------------------------------------------- Problem List Details Patient Name: Keith Roach, Keith M. Date of Service: 01/15/2019 1:30 PM Medical Record Number: [id number] Patient Account Number: [id number] Date of Birth/Sex: 16-Sep-1976 (43 y.o. M) Treating RN: Curtis Sitesorthy, Joanna Primary Care Provider: Scarlett PrestoHUIN, ALEXANDRE Other Clinician: Referring Provider: Scarlett PrestoHUIN, ALEXANDRE Treating Provider/Extender: Linwood DibblesSTONE III, HOYT Weeks in Treatment: 2 Active Problems ICD-10 Evaluated Encounter Code Description Active Date Today Diagnosis E11.621 Type 2 diabetes mellitus with foot ulcer 12/27/2018 No Yes L97.512 Non-pressure chronic ulcer of other part of right foot with fat 12/27/2018 No Yes layer exposed I10 Essential (primary) hypertension  12/27/2018 No Yes E11.43 Type 2 diabetes mellitus with diabetic autonomic (poly) 12/27/2018 No Yes neuropathy Inactive Problems Resolved Problems Electronic Signature(s) Signed: 01/20/2019 7:15:48 AM By: Lenda KelpStone III, Hoyt PA-C Entered By: Lenda KelpStone III, Hoyt on 01/15/2019 13:32:31 Keith Roach, Keith M. ([id number]) -------------------------------------------------------------------------------- Progress Note Details Patient Name: Keith Roach, Keith M. Date of Service: 01/15/2019 1:30 PM Medical Record Number: [id number] Patient Account Number: [id number] Date of Birth/Sex: 16-Sep-1976 (43 y.o. M) Treating RN: Curtis Sitesorthy, Joanna Primary Care Provider: Scarlett PrestoHUIN, ALEXANDRE Other Clinician: Referring Provider: Scarlett PrestoHUIN, ALEXANDRE Treating Provider/Extender: Linwood DibblesSTONE III, HOYT Weeks in Treatment: 2 Subjective Chief Complaint Information obtained from Patient Right foot ulcer History of Present Illness (HPI) 12/27/18 on evaluation today patient presents for initial inspection in our clinic during issues that he is having with his right great toe. 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 He is a current smoker and his blood sugar from between 150 and 170 although he does not have a recent A1c. He does have neuropathy and has no feeling in his feet. His ABI's today were noncompressible on the right 1.11 on the left. He does have a history of hypertension along with the type II diabetes. He tells me that in the beginning he felt like this was more of a blister over his toe where he had a callous. Subsequently in the past several days especially the past week it became obvious that he was having bleeding from the site. He has not really going anywhere to have this evaluated he just been taking care of it for the most part on his own. With that being said no one has done the x-rays previously seen podiatry. No fevers, chills, nausea, or vomiting noted at this time. He does have a  significant amount of callous buildup over the right first toe. 01/03/19 on evaluation today patient appears to be the doing okay in regard to his right great toe. We did have an x-ray performed which was negative for obvious signs of acute osteomyelitis. With that being said the tissue in the base of the wound still appears to be necrotic on evaluation today and there is bone exposed at the base of the wound. This has me concerned nonetheless with the possibility of active infection with osteomyelitis. This was discussed with the patient today. Nonetheless we  did receive his deep wound tissue culture back which showed moderate streptococcus anginosis and peptosteptococcus micros. Nonetheless it looks as if treatment with Levaquin may be a very good option for him at this point. The toe was still very erythematous today but necrotic tissue in the base of the wound and a malodorous smell to the wound bed. No fevers, chills, nausea, or vomiting noted at this time. 01/15/19 on evaluation today patient appears to be doing about the same in regard to his foot ulcer. This is on the right great toe. With that being said he states that the Iodoflex seems to make this much more went any somewhat concerned about that. Fortunately there's no signs of worsening infection although there still malodorous drainage noted at this point. No fevers, chills, nausea, or vomiting noted at this time. He has been taking the Augmentin. Patient History Information obtained from Patient. Family History Diabetes - Father, Heart Disease - Father, Hypertension - Father, Kidney Disease - Father, No family history of Cancer, Lung Disease, Seizures, Stroke, Thyroid Problems, Tuberculosis. Social History Current every day smoker - 25 years, Marital Status - Married, Alcohol Use - Moderate, Drug Use - No History, Caffeine Use - Never. Medical History Eyes Denies history of Cataracts, Glaucoma, Optic  Neuritis Ear/Nose/Mouth/Throat Denies history of Chronic sinus problems/congestion, Middle ear problems Keith Roach, Keith Roach ([id number]) Hematologic/Lymphatic Denies history of Anemia, Hemophilia, Human Immunodeficiency Virus, Lymphedema, Sickle Cell Disease Respiratory Denies history of Aspiration, Asthma, Chronic Obstructive Pulmonary Disease (COPD), Pneumothorax, Sleep Apnea, Tuberculosis Cardiovascular Patient has history of Hypertension Denies history of Angina, Arrhythmia, Congestive Heart Failure, Coronary Artery Disease, Deep Vein Thrombosis, Hypotension, Myocardial Infarction, Peripheral Arterial Disease, Peripheral Venous Disease, Phlebitis, Vasculitis Gastrointestinal Denies history of Cirrhosis , Colitis, Crohn s, Hepatitis A, Hepatitis B, Hepatitis C Endocrine Patient has history of Type II Diabetes Denies history of Type I Diabetes Genitourinary Denies history of End Stage Renal Disease Immunological Denies history of Lupus Erythematosus, Raynaud s, Scleroderma Integumentary (Skin) Denies history of History of Burn, History of pressure wounds Musculoskeletal Patient has history of Osteomyelitis - Left Transmet Denies history of Gout, Rheumatoid Arthritis, Osteoarthritis Neurologic Patient has history of Neuropathy - Feet Denies history of Dementia, Quadriplegia, Paraplegia, Seizure Disorder Oncologic Denies history of Received Chemotherapy, Received Radiation Psychiatric Denies history of Anorexia/bulimia, Confinement Anxiety Hospitalization/Surgery History - 03/05/2018, [facility], transmet amputation. Review of Systems (ROS) Constitutional Symptoms (General Health) Denies complaints or symptoms of Fever, Chills. Respiratory The patient has no complaints or symptoms. Cardiovascular The patient has no complaints or symptoms. Psychiatric The patient has no complaints or symptoms. Objective Constitutional Well-nourished and well-hydrated in no acute distress. Vitals  Time Taken: 1:46 PM, Height: 76 in, Weight: 300.4 lbs, BMI: 36.6, Temperature: 98.4 F, Pulse: 88 bpm, Respiratory Rate: 18 breaths/min, Blood Pressure: 172/76 mmHg. Keith Roach, Keith M. ([id number]) Respiratory normal breathing without difficulty. Psychiatric this patient is able to make decisions and demonstrates good insight into disease process. Alert and Oriented x 3. pleasant and cooperative. General Notes: Patient's wound bed currently shows signs of necrotic tissue including what appeared to be muscle as well in the wound bed. This required sharp debridement to clear away the callous as well is necrotic material from the base of the wound. This was performed fairly extensively post debridement the wound bed appears to be doing much better unfortunately there is still obviously drainage and odor noted from the area due to the necrotic tissue. Integumentary (Hair, Skin) Wound #1 status is Open. Original cause of wound was  Gradually Appeared. The wound is located on the Right Toe Great. The wound measures 1.2cm length x 0.7cm width x 1.2cm depth; 0.66cm^2 area and 0.792cm^3 volume. There is tendon and Fat Layer (Subcutaneous Tissue) Exposed exposed. There is no tunneling or undermining noted. There is a medium amount of sanguinous drainage noted. Foul odor after cleansing was noted. The wound margin is thickened. There is no granulation within the wound bed. There is a large (67-100%) amount of necrotic tissue within the wound bed including Adherent Slough. The periwound skin appearance exhibited: Callus, Dry/Scaly, Erythema. The periwound skin appearance did not exhibit: Crepitus, Excoriation, Induration, Rash, Scarring, Maceration, Atrophie Blanche, Cyanosis, Ecchymosis, Hemosiderin Staining, Mottled, Pallor, Rubor. The surrounding wound skin color is noted with erythema. Periwound temperature was noted as No Abnormality. Assessment Active Problems ICD-10 Type 2 diabetes mellitus with  foot ulcer Non-pressure chronic ulcer of other part of right foot with fat layer exposed Essential (primary) hypertension Type 2 diabetes mellitus with diabetic autonomic (poly)neuropathy Procedures Wound #1 Pre-procedure diagnosis of Wound #1 is a Diabetic Wound/Ulcer of the Lower Extremity located on the Right Toe Great .Severity of Tissue Pre Debridement is: Necrosis of muscle. There was a Excisional Skin/Subcutaneous Tissue/Muscle Debridement with a total area of 0.84 sq cm performed by STONE III, HOYT E., PA-C. With the following instrument(s): Curette to remove Viable and Non-Viable tissue/material. Material removed includes Muscle, Subcutaneous Tissue, and Slough after achieving pain control using Lidocaine 4% Topical Solution. No specimens were taken. A time out was conducted at 14:21, prior to the start of the procedure. A Minimum amount of bleeding was controlled with Pressure. The procedure was tolerated well with a pain level of 0 throughout and a pain level of 0 following the procedure. Post Debridement Measurements: 1.2cm length x 0.7cm width x 1.4cm depth; 0.924cm^3 volume. Character of Wound/Ulcer Post Debridement is improved. Severity of Tissue Post Debridement is: Necrosis of muscle. Post procedure Diagnosis Wound #1: Same as Pre-Procedure Keith Roach, Keith Roach. ([id number]) Plan Wound Cleansing: Wound #1 Right Toe Great: Clean wound with Normal Saline. May shower with protection. Anesthetic (add to Medication List): Wound #1 Right Toe Great: Topical Lidocaine 4% cream applied to wound bed prior to debridement (In Clinic Only). Primary Wound Dressing: Wound #1 Right Toe Great: Gentamicin Sulfate Cream - to wound bed Silver Alginate - over gentamicin cream Secondary Dressing: Wound #1 Right Toe Great: Conform/Kerlix Foam Dressing Change Frequency: Wound #1 Right Toe Great: Change dressing every other day. Follow-up Appointments: Wound #1 Right Toe Great: Return  Appointment in 1 week. The following medication(s) was prescribed: gentamicin topical 0.1 % cream cream topical applied to the wound with each dressing change as directed. starting 01/15/2019 Patient has his MRI scheduled for tomorrow. We are gonna subsequently see how things do as far as the test is concerned. Once we get the results of that will have a better idea of how things are gonna proceed. If anything changes or worsens in the meantime the patient will contact the office and let us know. Otherwise if we note that he does have indeed a bone infection then obviously the goal at that point is going to be aggressive treatment of that either by way of invitation if you decide to go that route or else if you want to try to save the toe we would focus on likely IV antibiotics and potentially hyperbaric option therapy. He's in agreement this plan. We will see were things stand at follow-up. Please see above for specific wound  care orders. We will see patient for re-evaluation in 1 week(s) here in the clinic. If anything worsens or changes patient will contact our office for additional recommendations. Electronic Signature(s) Signed: 01/20/2019 7:15:48 AM By: Lenda Kelp PA-C Entered By: Lenda Kelp on 01/15/2019 14:33:40 Keith Roach ([id number]) -------------------------------------------------------------------------------- ROS/PFSH Details Patient Name: Keith Roach. Date of Service: 01/15/2019 1:30 PM Medical Record Number: [id number] Patient Account Number: [id number] Date of Birth/Sex: Sep 21, 1976 (43 y.o. M) Treating RN: Curtis Sites Primary Care Provider: Scarlett Presto Other Clinician: Referring Provider: Scarlett Presto Treating Provider/Extender: Linwood Dibbles, HOYT Weeks in Treatment: 2 Information Obtained From Patient Wound History Do you currently have one or more open woundso Yes How many open wounds do you currently haveo 1 Approximately how long have you had  your woundso 12/24/2018 Has your wound(s) ever healed and then re-openedo No Have you had any lab work done in the past montho No Have you tested positive for an antibiotic resistant organism (MRSA, VRE)o No Have you tested positive for osteomyelitis (bone infection)o Yes Date: 03/05/2018 Have you had any tests for circulation on your legso Yes Where was the test [facility] Have you had other problems associated with your woundso Infection Constitutional Symptoms (General Health) Complaints and Symptoms: Negative for: Fever; Chills Eyes Medical History: Negative for: Cataracts; Glaucoma; Optic Neuritis Ear/Nose/Mouth/Throat Medical History: Negative for: Chronic sinus problems/congestion; Middle ear problems Hematologic/Lymphatic Medical History: Negative for: Anemia; Hemophilia; Human Immunodeficiency Virus; Lymphedema; Sickle Cell Disease Respiratory Complaints and Symptoms: No Complaints or Symptoms Medical History: Negative for: Aspiration; Asthma; Chronic Obstructive Pulmonary Disease (COPD); Pneumothorax; Sleep Apnea; Tuberculosis Cardiovascular Complaints and Symptoms: No Complaints or Symptoms DUGAN, VANHOESEN. ([id number]) Medical History: Positive for: Hypertension Negative for: Angina; Arrhythmia; Congestive Heart Failure; Coronary Artery Disease; Deep Vein Thrombosis; Hypotension; Myocardial Infarction; Peripheral Arterial Disease; Peripheral Venous Disease; Phlebitis; Vasculitis Gastrointestinal Medical History: Negative for: Cirrhosis ; Colitis; Crohnos; Hepatitis A; Hepatitis B; Hepatitis C Endocrine Medical History: Positive for: Type II Diabetes Negative for: Type I Diabetes Time with diabetes: 2015 Treated with: Oral agents Blood sugar tested every day: Yes Tested : 2 Genitourinary Medical History: Negative for: End Stage Renal Disease Immunological Medical History: Negative for: Lupus Erythematosus; Raynaudos; Scleroderma Integumentary  (Skin) Medical History: Negative for: History of Burn; History of pressure wounds Musculoskeletal Medical History: Positive for: Osteomyelitis - Left Transmet Negative for: Gout; Rheumatoid Arthritis; Osteoarthritis Neurologic Medical History: Positive for: Neuropathy - Feet Negative for: Dementia; Quadriplegia; Paraplegia; Seizure Disorder Oncologic Medical History: Negative for: Received Chemotherapy; Received Radiation Psychiatric Complaints and Symptoms: No Complaints or Symptoms Medical History: Negative for: Anorexia/bulimia; Confinement Anxiety GABLE, ODONOHUE ([id number]) Immunizations Pneumococcal Vaccine: Received Pneumococcal Vaccination: No Implantable Devices Hospitalization / Surgery History Name of Hospital Purpose of Hospitalization/[place] Date [facility] transmet amputation 03/05/2018 Family and Social History Cancer: No; Diabetes: Yes - Father; Heart Disease: Yes - Father; Hypertension: Yes - Father; Kidney Disease: Yes - Father; Lung Disease: No; Seizures: No; Stroke: No; Thyroid Problems: No; Tuberculosis: No; Current every day smoker - 25 years; Marital Status - Married; Alcohol Use: Moderate; Drug Use: No History; Caffeine Use: Never; Advanced Directives: No; Patient does not want information on Advanced Directives; Do not resuscitate: No; Living Will: No; Medical Power of Attorney: No Physician Affirmation I have reviewed and agree with the above information. Electronic Signature(s) Signed: 01/15/2019 4:52:44 PM By: Curtis Sites Signed: 01/20/2019 7:15:48 AM By: Lenda Kelp PA-C Entered By: Lenda Kelp on 01/15/2019 14:31:14 Keith Roach ([id number]) --------------------------------------------------------------------------------  SuperBill Details Patient Name: Keith Roach, Rodriques M. Date of Service: 01/15/2019 Medical Record Number: [id number] Patient Account Number: [id number] Date of Birth/Sex: November 08, 1976 (43 y.o. 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 Facility Procedures CPT4 Code: 9147829536100014 Description: 11043 - DEB MUSC/FASCIA 20 SQ CM/< ICD-10 Diagnosis Description L97.512 Non-pressure chronic ulcer of other part of right foot with fat Modifier: layer exposed Quantity: 1 Physician Procedures CPT4 Code: [id number] Description: 11043 - WC PHYS DEBR MUSCLE/FASCIA 20 SQ CM ICD-10 Diagnosis Description L97.512 Non-pressure chronic ulcer of other part of right foot with fat Modifier: layer exposed Quantity: 1 Electronic Signature(s) Signed: 01/20/2019 7:15:48 AM By: Lenda KelpStone III, Hoyt PA-C Entered By: Lenda KelpStone III, Hoyt on 01/15/2019 14:33:48

## 2019-01-21 NOTE — Progress Notes (Signed)
Keith Roach, Abeer M. ([id number]) Visit Report for 01/15/2019 Arrival Information Details Patient Name: Keith Roach, Keith M. Date of Service: 01/15/2019 1:30 PM Medical Record Number: [id number] Patient Account Number: [id number] Date of Birth/Sex: Oct 08, 1976 (43 y.o. M) Treating RN: Arnette NorrisBiell, Kristina Primary Care Matthan Sledge: Scarlett PrestoHUIN, ALEXANDRE Other Clinician: Referring Breydan Shilling[place]: Scarlett PrestoHUIN, ALEXANDRE Treating Evalyse Stroope/Extender: Linwood DibblesSTONE III, HOYT Weeks in Treatment: 2 Visit Information History Since Last Visit Added or deleted any medications: No Patient Arrived: Ambulatory Any new allergies or adverse reactions: No Arrival Time: 13:45 Had a fall or experienced change in No Accompanied By: self activities of daily living that may affect Transfer Assistance: None risk of falls: Patient Identification Verified: No Signs or symptoms of abuse/neglect since last visito No Secondary Verification Process Completed: No Hospitalized since last visit: No Pain Present Now: No Electronic Signature(s) Signed: 01/21/2019 9:36:27 AM By: Arnette NorrisBiell, Kristina Entered By: Arnette NorrisBiell, Kristina on 01/15/2019 13:46:38 Keith Roach, Keith M. ([id number]) -------------------------------------------------------------------------------- Encounter Discharge Information Details Patient Name: Keith Roach, Keith M. Date of Service: 01/15/2019 1:30 PM Medical Record Number: [id number] Patient Account Number: [id number] Date of Birth/Sex: Oct 08, 1976 (43 y.o. M) Treating RN: Rodell PernaScott, Dajea Primary Care Jaece Ducharme: Scarlett PrestoHUIN, ALEXANDRE Other Clinician: Referring Bryten Maher: Scarlett PrestoHUIN, ALEXANDRE Treating Kimaria Struthers/Extender: Linwood DibblesSTONE III, HOYT Weeks in Treatment: 2 Encounter Discharge Information Items Post Procedure Vitals Discharge Condition: Stable Temperature (F): 98.4 Ambulatory Status: Ambulatory Pulse (bpm): 88 Discharge Destination: Home Respiratory Rate (breaths/min): 16 Transportation: Private Auto Blood Pressure (mmHg): 172/76 Accompanied By: self Schedule  Follow-up Appointment: Yes Clinical Summary of Care: Electronic Signature(s) Signed: 01/15/2019 4:35:39 PM By: Rodell PernaScott, Dajea Entered By: Rodell PernaScott, Dajea on 01/15/2019 14:37:27 Keith Roach, Zacherie M. ([id number]) -------------------------------------------------------------------------------- Lower Extremity Assessment Details Patient Name: Keith Roach, Keith M. Date of Service: 01/15/2019 1:30 PM Medical Record Number: [id number] Patient Account Number: [id number] Date of Birth/Sex: Oct 08, 1976 (43 y.o. M) Treating RN: Arnette NorrisBiell, Kristina Primary Care Akash Winski: Scarlett PrestoHUIN, ALEXANDRE Other Clinician: Referring Lolah Coghlan: Scarlett PrestoHUIN, ALEXANDRE Treating Miesha Bachmann/Extender: Linwood DibblesSTONE III, HOYT Weeks in Treatment: 2 Electronic Signature(s) Signed: 01/21/2019 9:36:27 AM By: Arnette NorrisBiell, Kristina Entered By: Arnette NorrisBiell, Kristina on 01/15/2019 13:54:44 Keith Roach, Keith M. ([id number]) -------------------------------------------------------------------------------- Multi Wound Chart Details Patient Name: Keith Roach, Keith M. Date of Service: 01/15/2019 1:30 PM Medical Record Number: [id number] Patient Account Number: [id number] Date of Birth/Sex: Oct 08, 1976 (43 y.o. M) Treating RN: Curtis Sitesorthy, Joanna Primary Care Honora Searson: Scarlett PrestoHUIN, ALEXANDRE Other Clinician: Referring Calia Napp: Scarlett PrestoHUIN, ALEXANDRE Treating Jaquarious Grey/Extender: STONE III, HOYT Weeks in Treatment: 2 Vital Signs Height(in): 76 Pulse(bpm): 88 Weight(lbs): 300.4 Blood Pressure(mmHg): 172/76 Body Mass Index(BMI): 37 Temperature(F): 98.4 Respiratory Rate 18 (breaths/min): Photos: [1:No Photos] [N/A:N/A] Wound Location: [1:Right Toe Great] [N/A:N/A] Wounding Event: [1:Gradually Appeared] [N/A:N/A] Primary Etiology: [1:Diabetic Wound/Ulcer of the Lower Extremity] [N/A:N/A] Comorbid History: [1:Hypertension, Type II Diabetes, Osteomyelitis, Neuropathy] [N/A:N/A] Date Acquired: [1:12/17/2018] [N/A:N/A] Weeks of Treatment: [1:2] [N/A:N/A] Wound Status: [1:Open] [N/A:N/A] Pending Amputation on  [1:Yes] [N/A:N/A] Presentation: Measurements L x W x D [1:1.2x0.7x1.2] [N/A:N/A] (cm) Area (cm) : [1:0.66] [N/A:N/A] Volume (cm) : [1:0.792] [N/A:N/A] % Reduction in Area: [1:-16.80%] [N/A:N/A] % Reduction in Volume: [1:-16.60%] [N/A:N/A] Classification: [1:Grade 2] [N/A:N/A] Exudate Amount: [1:Medium] [N/A:N/A] Exudate Type: [1:Sanguinous] [N/A:N/A] Exudate Color: [1:red] [N/A:N/A] Foul Odor After Cleansing: [1:Yes] [N/A:N/A] Odor Anticipated Due to [1:No] [N/A:N/A] Product Use: Wound Margin: [1:Thickened] [N/A:N/A] Granulation Amount: [1:None Present (0%)] [N/A:N/A] Necrotic Amount: [1:Large (67-100%)] [N/A:N/A] Exposed Structures: [1:Fat Layer (Subcutaneous Tissue) Exposed: Yes Tendon: Yes Fascia: No Muscle: No Joint: No Bone: No] [N/A:N/A] Epithelialization: [1:None] [N/A:N/A] Periwound Skin Texture: Callus: Yes N/A N/A Excoriation: No Induration: No Crepitus: No Rash: No Scarring: No Periwound Skin Moisture: Dry/Scaly:  Yes N/A N/A Maceration: No Periwound Skin Color: Erythema: Yes N/A N/A Atrophie Blanche: No Cyanosis: No Ecchymosis: No Hemosiderin Staining: No Mottled: No Pallor: No Rubor: No Temperature: No Abnormality N/A N/A Tenderness on Palpation: No N/A N/A Wound Preparation: Ulcer Cleansing: N/A N/A Rinsed/Irrigated with Saline Topical Anesthetic Applied: Other: lidocaine 4% Treatment Notes Electronic Signature(s) Signed: 01/15/2019 4:52:44 PM By: Curtis Sites Entered By: Curtis Sites on 01/15/2019 14:21:12 Keith Roach ([id number]) -------------------------------------------------------------------------------- Multi-Disciplinary Care Plan Details Patient Name: Keith Roach, CHENAULT. Date of Service: 01/15/2019 1:30 PM Medical Record Number: [id number] Patient Account Number: [id number] Date of Birth/Sex: November 24, 1976 (43 y.o. M) Treating RN: Curtis Sites Primary Care Tonye Tancredi: Scarlett Presto Other Clinician: Referring Shannelle Alguire: Scarlett Presto Treating Fedora Knisely/Extender: Linwood Dibbles, HOYT Weeks in Treatment: 2 Active Inactive Abuse / Safety / Falls / Self Care Management Nursing Diagnoses: Potential for falls Goals: Patient will remain injury free related to falls Date Initiated: 12/27/2018 Target Resolution Date: 03/09/2019 Goal Status: Active Interventions: Assess fall risk on admission and as needed Notes: Necrotic Tissue Nursing Diagnoses: Impaired tissue integrity related to necrotic/devitalized tissue Goals: Necrotic/devitalized tissue will be minimized in the wound bed Date Initiated: 12/27/2018 Target Resolution Date: 03/09/2019 Goal Status: Active Interventions: Assess patient pain level pre-, during and post procedure and prior to discharge Notes: Orientation to the Wound Care Program Nursing Diagnoses: Knowledge deficit related to the wound healing center program Goals: Patient/caregiver will verbalize understanding of the Wound Healing Center Program Date Initiated: 12/27/2018 Target Resolution Date: 03/09/2019 Goal Status: Active Interventions: Provide education on orientation to the wound center [place], Earlene Plater M. ([id number]) Notes: Wound/Skin Impairment Nursing Diagnoses: Impaired tissue integrity Goals: Ulcer/skin breakdown will heal within 14 weeks Date Initiated: 12/27/2018 Target Resolution Date: 03/09/2019 Goal Status: Active Interventions: Assess patient/caregiver ability to obtain necessary supplies Assess patient/caregiver ability to perform ulcer/skin care regimen upon admission and as needed Assess ulceration(s) every visit Notes: Electronic Signature(s) Signed: 01/15/2019 4:52:44 PM By: Curtis Sites Entered By: Curtis Sites on 01/15/2019 14:21:05 Keith Roach ([id number]) -------------------------------------------------------------------------------- Pain Assessment Details Patient Name: Keith Roach. Date of Service: 01/15/2019 1:30 PM Medical Record Number:  [id number] Patient Account Number: [id number] Date of Birth/Sex: 01-Apr-1976 (43 y.o. M) Treating RN: Arnette Norris Primary Care Delphine Sizemore: Scarlett Presto Other Clinician: Referring Manley Fason: Scarlett Presto Treating Jalea Bronaugh/Extender: Linwood Dibbles, HOYT Weeks in Treatment: 2 Active Problems Location of Pain Severity and Description of Pain Patient Has Paino No Site Locations Pain Management and Medication Current Pain Management: Electronic Signature(s) Signed: 01/21/2019 9:36:27 AM By: Arnette Norris Entered By: Arnette Norris on 01/15/2019 13:46:51 Keith Roach ([id number]) -------------------------------------------------------------------------------- Patient/Caregiver Education Details Patient Name: Keith Roach. Date of Service: 01/15/2019 1:30 PM Medical Record Number: [id number] Patient Account Number: [id number] Date of Birth/Gender: [birth year] (43 y.o. M) Treating RN: Curtis Sites Primary Care Physician: Scarlett Presto Other Clinician: Referring Physician: Scarlett Presto Treating Physician/Extender: Skeet Simmer in Treatment: 2 Education Assessment Education Provided To: Patient Education Topics Provided Wound/Skin Impairment: Handouts: Other: wound care as ordered Methods: Demonstration, Explain/Verbal Responses: State content correctly Electronic Signature(s) Signed: 01/15/2019 4:52:44 PM By: Curtis Sites Entered By: Curtis Sites on 01/15/2019 14:28:02 Keith Roach ([id number]) -------------------------------------------------------------------------------- Wound Assessment Details Patient Name: Keith Roach. Date of Service: 01/15/2019 1:30 PM Medical Record Number: [id number] Patient Account Number: [id number] Date of Birth/Sex: [birth year] (43 y.o. M) Treating RN: Arnette Norris Primary Care Amamda Curbow: Scarlett Presto Other Clinician: Referring Koreen Lizaola: Scarlett Presto Treating Lonnetta Kniskern/Extender: STONE III, HOYT Weeks in Treatment:  2 Wound Status Wound  Number: 1 Primary Diabetic Wound/Ulcer of the Lower Extremity Etiology: Wound Location: Right Toe Great Wound Status: Open Wounding Event: Gradually Appeared Comorbid Hypertension, Type II Diabetes, Date Acquired: 12/17/2018 History: Osteomyelitis, Neuropathy Weeks Of Treatment: 2 Clustered Wound: No Pending Amputation On Presentation Photos Photo Uploaded By: Arnette Norris on 01/15/2019 16:17:45 Wound Measurements Length: (cm) 1.2 Width: (cm) 0.7 Depth: (cm) 1.2 Area: (cm) 0.66 Volume: (cm) 0.792 % Reduction in Area: -16.8% % Reduction in Volume: -16.6% Epithelialization: None Tunneling: No Undermining: No Wound Description Classification: Grade 2 Wound Margin: Thickened Exudate Amount: Medium Exudate Type: Sanguinous Exudate Color: red Foul Odor After Cleansing: Yes Due to Product Use: No Slough/Fibrino Yes Wound Bed Granulation Amount: None Present (0%) Exposed Structure Necrotic Amount: Large (67-100%) Fascia Exposed: No Necrotic Quality: Adherent Slough Fat Layer (Subcutaneous Tissue) Exposed: Yes Tendon Exposed: Yes Muscle Exposed: No Joint Exposed: No Bone Exposed: No Periwound Skin Texture Keith Roach, [facility]. ([id number]) Texture Color No Abnormalities Noted: No No Abnormalities Noted: No Callus: Yes Atrophie Blanche: No Crepitus: No Cyanosis: No Excoriation: No Ecchymosis: No Induration: No Erythema: Yes Rash: No Hemosiderin Staining: No Scarring: No Mottled: No Pallor: No Moisture Rubor: No No Abnormalities Noted: No Dry / Scaly: Yes Temperature / Pain Maceration: No Temperature: No Abnormality Wound Preparation Ulcer Cleansing: Rinsed/Irrigated with Saline Topical Anesthetic Applied: Other: lidocaine 4%, Treatment Notes Wound #1 (Right Toe Great) 1. Cleansed with: Clean wound with Normal Saline 2. Anesthetic Topical Lidocaine 4% cream to wound bed prior to debridement 3. Peri-wound Care: Other peri-wound  care (specify in notes) 4. Dressing Applied: Other dressing (specify in notes) 5. Secondary Dressing Applied Foam Kerlix/Conform Notes Gentamicin, Silver Cell Electronic Signature(s) Signed: 01/15/2019 4:52:44 PM By: Curtis Sites Signed: 01/21/2019 9:36:27 AM By: Arnette Norris Entered By: Curtis Sites on 01/15/2019 14:20:58 Keith Roach ([id number]) -------------------------------------------------------------------------------- Vitals Details Patient Name: Keith Roach. Date of Service: 01/15/2019 1:30 PM Medical Record Number: [id number] Patient Account Number: [id number] Date of Birth/Sex: [birth year] (43 y.o. M) Treating RN: Arnette Norris Primary Care Aleeza [place]: Scarlett Presto Other Clinician: Referring Cher Egnor: Scarlett Presto Treating Pawan Knechtel/Extender: STONE III, HOYT Weeks in Treatment: 2 Vital Signs Time Taken: 13:46 Temperature (F): 98.4 Height (in): 76 Pulse (bpm): 88 Weight (lbs): 300.4 Respiratory Rate (breaths/min): 18 Body Mass Index (BMI): 36.6 Blood Pressure (mmHg): 172/76 Reference Range: 80 - 120 mg / dl Airway Electronic Signature(s) Signed: 01/21/2019 9:36:27 AM By: Arnette Norris Entered By: Arnette Norris on 01/15/2019 13:47:10

## 2019-01-24 ENCOUNTER — Encounter: Payer: 59 | Admitting: Physician Assistant

## 2019-01-24 DIAGNOSIS — E11621 Type 2 diabetes mellitus with foot ulcer: Secondary | ICD-10-CM | POA: Diagnosis not present

## 2019-01-25 NOTE — Progress Notes (Signed)
Keith Roach, Keith Roach ([id number]) Visit Report for 01/24/2019 HBO Risk Assessment Details Patient Name: Keith Roach, Keith Roach. Date of Service: 01/24/2019 8:00 AM Medical Record Number: [id number] Patient Account Number: [id number] Date of Birth/Sex: [birth year] (43 y.o. M) Treating RN: Huel Coventry Primary Care Angellina Ferdinand: Scarlett Presto Other Clinician: Referring Zeidy Tayag: Scarlett Presto Treating Zarinah Oviatt/Extender: Linwood Dibbles, HOYT Weeks in Treatment: 4 HBO Risk Assessment Items Answer Barotrauma Risks: Upper Respiratory Infections No Prior Radiation Treatment to Head/Neck No Tracheostomy No Ear problems or surgery (otosclerosis)- Consider pressure equalization tubes No Sinus Problems, Sinus Obstruction No Pulmonary Risks: Currently seeing a pulmonologisto No Emphysema No Pneumothorax No Tuberculosis No Other lung problems (COPD with CO2 retention, lesions, surgery) -Refer to CPGs No Congestive heart Failure -Consider holding HBO if ejection fraction<30% No History of smoking No Bullous Disease, Blebs No Other pulmonary abnormalities No Cardiac Risks: Currently seeing a cardiologisto No Pacemaker/AICD No Hypertension Yes Diuretic Used (water pill). If yes, last time taken: No History of prior or current malignancy (Cancer) Surgery No Radiation therapy No Chemotherapy No Ophthalmic Risks: Optic Neuritis No Cataracts No Myopia No Retinopathy or Retinal Detachment Surgery- Consider pressure equalization tubes No Confinement Anxiety Claustrophobia No Keith Roach, Keith Roach ([id number]) Dialysis Dialysis No Any implants; medical or non-medical No Pregnancy No Diabetes if Yes for Diabetes: Non-Insulin Dependent Diabetes Medication: metformin, januvia HgbA1C within 3 months No Seizures Seizures No Currently using these medications: Aspirin No Digoxin (CHF patient) No Narcotics No Nitroprusside No Phenothiazine (Thorazine,etc.) No Prednisone or other steroids No Disulfiram (Antabuse)  No Mafenide Acetate (Sulfamylon-burn cream) No Amiodarone No Electronic Signature(s) Signed: 01/24/2019 5:17:15 PM By: Elliot Gurney, BSN, RN, CWS, Kim RN, BSN Entered By: Elliot Gurney, BSN, RN, CWS, Kim on 01/24/2019 08:45:14

## 2019-01-31 ENCOUNTER — Ambulatory Visit: Payer: 59 | Admitting: Physician Assistant

## 2019-02-01 NOTE — Progress Notes (Signed)
Keith, Roach ([id number]) Visit Report for 01/24/2019 Chief Complaint Document Details Patient Name: Keith Roach, Keith Roach. Date of Service: 01/24/2019 8:00 AM Medical Record Number: [id number] Patient Account Number: [id number] Date of Birth/Sex: [birth year] (43 y.o. Male) Treating RN: Arnette Norris Primary Care Provider: Scarlett Presto Other Clinician: Referring Provider: Scarlett Presto Treating Provider/Extender: Linwood Dibbles, HOYT Weeks in Treatment: 4 Information Obtained from: Patient Chief Complaint Right foot ulcer Electronic Signature(s) Signed: 01/31/2019 9:19:49 AM By: Lenda Kelp PA-C Entered By: Lenda Kelp on 01/24/2019 08:28:00 Keith Roach ([id number]) -------------------------------------------------------------------------------- Debridement Details Patient Name: Keith Roach. Date of Service: 01/24/2019 8:00 AM Medical Record Number: [id number] Patient Account Number: [id number] Date of Birth/Sex: [birth year] (43 y.o. Male) Treating RN: Rodell Perna Primary Care Provider: Scarlett Presto Other Clinician: Referring Provider: Scarlett Presto Treating Provider/Extender: Linwood Dibbles, HOYT Weeks in Treatment: 4 Debridement Performed for Wound #1 Right Toe Great Assessment: Performed By: Physician STONE III, HOYT E., PA-C Debridement Type: Debridement Severity of Tissue Pre Other severity specified Debridement: Level of Consciousness (Pre- Awake and Alert procedure): Pre-procedure Verification/Time Yes - 08:34 Out Taken: Start Time: 08:34 Pain Control: Lidocaine Total Area Debrided (Keith Roach x W): 0.9 (cm) x 0.9 (cm) = 0.81 (cm) Tissue and other material Viable, Non-Viable, Callus, Slough, Subcutaneous, Tendon, Slough debrided: Level: Skin/Subcutaneous Tissue/Muscle Debridement Description: Excisional Instrument: Curette Bleeding: Moderate Hemostasis Achieved: Pressure End Time: 08:40 Response to Treatment: Procedure was tolerated well Level of  Consciousness Awake and Alert (Post-procedure): Post Debridement Measurements of Total Wound Length: (cm) 0.9 Width: (cm) 0.9 Depth: (cm) 1 Volume: (cm) 0.636 Character of Wound/Ulcer Post Debridement: Stable Severity of Tissue Post Debridement: Other severity specified Post Procedure Diagnosis Same as Pre-procedure Notes Tendon exposed Electronic Signature(s) Signed: 01/25/2019 4:09:31 PM By: Rodell Perna Signed: 01/31/2019 9:19:49 AM By: Lenda Kelp PA-C Entered By: Rodell Perna on 01/24/2019 08:37:34 Keith Roach ([id number]) -------------------------------------------------------------------------------- HPI Details Patient Name: Keith Roach. Date of Service: 01/24/2019 8:00 AM Medical Record Number: [id number] Patient Account Number: [id number] Date of Birth/Sex: 1976-09-24 (43 y.o. Male) Treating RN: Arnette Norris Primary Care Provider: Scarlett Presto Other Clinician: Referring Provider: Scarlett Presto Treating Provider/Extender: Linwood Dibbles, HOYT Weeks in Treatment: 4 History of Present Illness HPI Description: 12/27/18 on evaluation today patient presents for initial inspection in our clinic during issues that he is having with his right great toe. 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 He is a current smoker and his blood sugar from between 150 and 170 although he does not have a recent A1c. He does have neuropathy and has no feeling in his feet. His ABI's today were noncompressible on the right 1.11 on the left. He does have a history of hypertension along with the type II diabetes. He tells me that in the beginning he felt like this was more of a blister over his toe where he had a callous. Subsequently in the past several days especially the past week it became obvious that he was having bleeding from the site. He has not really going anywhere to have this evaluated he just been taking care of it for the  most part on his own. With that being said no one has done the x-rays previously seen podiatry. No fevers, chills, nausea, or vomiting noted at this time. He does have a significant amount of callous buildup over the right first toe. 01/03/19 on evaluation today patient appears to be the doing okay in regard to  his right great toe. We did have an x-ray performed which was negative for obvious signs of acute osteomyelitis. With that being said the tissue in the base of the wound still appears to be necrotic on evaluation today and there is bone exposed at the base of the wound. This has me concerned nonetheless with the possibility of active infection with osteomyelitis. This was discussed with the patient today. 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 Nonetheless it looks as if treatment with Levaquin may be a very good option for him at this point. The toe was still very erythematous today but necrotic tissue in the base of the wound and a malodorous smell to the wound bed. No fevers, chills, nausea, or vomiting noted at this time. 01/15/19 on evaluation today patient appears to be doing about the same in regard to his foot ulcer. This is on the right great toe. With that being said he states that the Iodoflex seems to make this much more went any somewhat concerned about that. Fortunately there's no signs of worsening infection although there still malodorous drainage noted at this point. No fevers, chills, nausea, or vomiting noted at this time. He has been taking the Augmentin. 01/24/19 on evaluation today patient presents for follow-up concerning his great toe ulcer. He is been tolerating the dressing changes without complication. Unfortunately he really does not seem to be making excellent progress. He still is a deep wound him ride to come back showing that he does have osteomyelitis in the great toe  on the distal aspect. Again this is something that was suspected based on examination and everything I was seeing but now confirmed. He has previously done hyperbaric for his left foot never for the right. I do think that's an option for him for this right foot as well. He's been thinking about that but is not sure that he wants to do that again. He also was not sure whether he would prefer to see a Careers adviser. Nonetheless were still waiting to have his decision in that regard. Electronic Signature(s) Signed: 01/31/2019 9:19:49 AM By: Lenda Kelp PA-C Entered By: Lenda Kelp on 01/24/2019 13:34:15 Keith Roach ([id number]) -------------------------------------------------------------------------------- Physical Exam Details Patient Name: Keith Roach, Keith Roach. Date of Service: 01/24/2019 8:00 AM Medical Record Number: [id number] Patient Account Number: [id number] Date of Birth/Sex: 27-Jul-1976 (43 y.o. Male) Treating RN: Arnette Norris Primary Care Provider: Scarlett Presto Other Clinician: Referring Provider: Scarlett Presto Treating Provider/Extender: STONE III, HOYT Weeks in Treatment: 4 Constitutional Well-nourished and well-hydrated in no acute distress. Respiratory normal breathing without difficulty. clear to auscultation bilaterally. Cardiovascular regular rate and rhythm with normal S1, S2. Psychiatric this patient is able to make decisions and demonstrates good insight into disease process. Alert and Oriented x 3. pleasant and cooperative. Notes Patient's wound bed currently shows signs of necrotic tissue in the service of the wound unfortunately. This did require sharp debridement today which was tolerated without complication. Post debridement wound bed appears to be doing much better although he still has a significant issue at this time. I'm gonna recommend that we go ahead and continue with the silver alginate dressing I think this is the best thing for him at this  point. Electronic Signature(s) Signed: 01/31/2019 9:19:49 AM By: Lenda Kelp PA-C Entered By: Lenda Kelp on 01/24/2019 13:35:07 Keith Roach ([id number]) -------------------------------------------------------------------------------- Physician Orders Details Patient Name: Keith Roach, Keith Roach. Date of Service: 01/24/2019  8:00 AM Medical Record Number: [id number] Patient Account Number: [id number] Date of Birth/Sex: 12/06/1975 (43 y.o. 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 o May shower with protection. Anesthetic (add to Medication List) Wound #1 Right Toe Great o Topical Lidocaine 4% cream applied to wound bed prior to debridement (In Clinic Only). Primary Wound Dressing Wound #1 Right Toe Great o Gentamicin Sulfate Cream - to wound bed o Silver Alginate - over gentamicin cream Secondary Dressing Wound #1 Right Toe Great o Conform/Kerlix o Foam Dressing Change Frequency Wound #1 Right Toe Great o Change dressing every other day. Follow-up Appointments Wound #1 Right Toe Great o Return Appointment in 1 week. Off-Loading Wound #1 Right Toe Great o Open toe surgical shoe with peg assist. Hyperbaric Oxygen Therapy Wound #1 Right Toe Keith Roach, Keith M. ([id number]) o Evaluate for HBO Therapy o Indication: - osteomyelitis o If appropriate for treatment, begin HBOT per protocol: o 2.0 ATA for 90  Minutes without Air Breaks o One treatment per day (delivered Monday through Friday unless otherwise specified in Special Instructions below): o Total # of Treatments: - 40 o Finger stick Blood Glucose Pre- and Post- HBOT Treatment. o Follow Hyperbaric Oxygen Glycemia Protocol Medications-please add to medication list. Wound #1 Right Toe Great o P.O. Antibiotics Consults o Infectious Disease Patient Medications Allergies: No Known Drug Allergies Notifications Medication Indication Start End Augmentin 01/24/2019 DOSE 1 - oral 875 mg-125 mg tablet - 1 tablet oral taken 2 times a day for 14 days GLYCEMIA INTERVENTIONS PROTOCOL PRE-HBO GLYCEMIA INTERVENTIONS ACTION INTERVENTION Obtain pre-HBO capillary blood glucose 1 (ensure physician order is in chart). A. Notify HBO physician and await physician orders. 2 If result is 70 mg/dl or below: B. If the result meets the hospital definition of a critical result, follow hospital policy. A. Give patient an 8 ounce Glucerna Shake, an 8 ounce Ensure, or 8 ounces of a Glucerna/Ensure equivalent dietary supplement*. B. Wait 30 minutes. C. Retest patientos capillary blood If result is 71 mg/dl to 782 mg/dl: glucose (CBG). D. If result greater than or equal to 110 mg/dl, proceed with HBO. If result less than 110 mg/dl, notify HBO physician and consider holding HBO. If result is 131 mg/dl to 956 mg/dl: A. Proceed with HBO. A. Notify HBO physician and await physician orders. B. It is recommended to hold HBO and If result is 250 mg/dl or greater: do blood/urine ketone testing. C. If the result meets the hospital definition of a critical result, follow hospital policy. RITESH, OPARA M. ([id number]) POST-HBO GLYCEMIA INTERVENTIONS ACTION INTERVENTION Obtain post HBO capillary blood glucose 1 (ensure physician order is in chart). A. Notify HBO physician and await physician orders. 2 If result is 70 mg/dl or below: B. If  the result meets the hospital definition of a critical result, follow hospital policy. A. Give patient an 8 ounce Glucerna Shake, an 8 ounce Ensure, or 8 ounces of a Glucerna/Ensure equivalent dietary supplement*. B. Wait 15 minutes for symptoms of hypoglycemia (i.e. nervousness, anxiety, sweating, chills, If result is 71 mg/dl to  100 mg/dl: clamminess, irritability, confusion, tachycardia or dizziness). C. If patient asymptomatic, discharge patient. If patient symptomatic, repeat capillary blood glucose (CBG) and notify HBO physician. If result is 101 mg/dl to 161 mg/dl: A. Discharge patient. A. Notify HBO physician and await physician orders. B. It is recommended to do blood/urine If result is 250 mg/dl or greater: ketone testing. C. If the result meets the hospital definition of a critical result, follow hospital policy. *Juice or candies are NOT equivalent products. If patient refuses the Glucerna or Ensure, please consult the hospital dietitian for an appropriate substitute. Electronic Signature(s) Signed: 01/24/2019 1:33:22 PM By: Lenda Kelp PA-C Entered By: Lenda Kelp on 01/24/2019 13:33:21 Keith Roach ([id number]) -------------------------------------------------------------------------------- Problem List Details Patient Name: Keith Roach, Keith Roach. Date of Service: 01/24/2019 8:00 AM Medical Record Number: [id number] Patient Account Number: [id number] Date of Birth/Sex: 09-11-1976 (43 y.o. Male) Treating RN: Arnette Norris Primary Care Provider: Scarlett Presto Other Clinician: Referring Provider: Scarlett Presto Treating Provider/Extender: Linwood Dibbles, HOYT Weeks in Treatment: 4 Active Problems ICD-10 Evaluated Encounter Code Description Active Date Today Diagnosis E11.621 Type 2 diabetes mellitus with foot ulcer 12/27/2018 No Yes L97.512 Non-pressure chronic ulcer of other part of right foot with fat 12/27/2018 No Yes layer exposed I10 Essential  (primary) hypertension 12/27/2018 No Yes E11.43 Type 2 diabetes mellitus with diabetic autonomic (poly) 12/27/2018 No Yes neuropathy Inactive Problems Resolved Problems Electronic Signature(s) Signed: 01/31/2019 9:19:49 AM By: Lenda Kelp PA-C Entered By: Lenda Kelp on 01/24/2019 08:27:47 Keith Roach ([id number]) -------------------------------------------------------------------------------- Progress Note Details Patient Name: Keith Roach. Date of Service: 01/24/2019 8:00 AM Medical Record Number: [id number] Patient Account Number: [id number] Date of Birth/Sex: 1976-09-01 (43 y.o. Male) Treating RN: Arnette Norris Primary Care Provider: Scarlett Presto Other Clinician: Referring Provider: Scarlett Presto Treating Provider/Extender: Linwood Dibbles, HOYT Weeks in Treatment: 4 Subjective Chief Complaint Information obtained from Patient Right foot ulcer History of Present Illness (HPI) 12/27/18 on evaluation today patient presents for initial inspection in our clinic during issues that he is having with his right great toe. 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 He is a current smoker and his blood sugar from between 150 and 170 although he does not have a recent A1c. He does have neuropathy and has no feeling in his feet. His ABI's today were noncompressible on the right 1.11 on the left. He does have a history of hypertension along with the type II diabetes. He tells me that in the beginning he felt like this was more of a blister over his toe where he had a callous. Subsequently in the past several days especially the past week it became obvious that he was having bleeding from the site. He has not really going anywhere to have this evaluated he just been taking care of it for the most part on his own. With that being said no one has done the x-rays previously seen podiatry. No fevers, chills, nausea, or vomiting noted at  this time. He does have a significant amount of callous buildup over the right first toe. 01/03/19 on evaluation today patient appears to be the doing okay in regard to his right great toe. We did have an x-ray performed which was negative for obvious signs of acute osteomyelitis. With that being said the tissue in the base of the wound still appears to be necrotic on evaluation today and there is bone exposed at the base of the wound.  This has me concerned nonetheless with the possibility of active infection with osteomyelitis. This was discussed with the patient today. 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 Nonetheless it looks as if treatment with Levaquin may be a very good option for him at this point. The toe was still very erythematous today but necrotic tissue in the base of the wound and a malodorous smell to the wound bed. No fevers, chills, nausea, or vomiting noted at this time. 01/15/19 on evaluation today patient appears to be doing about the same in regard to his foot ulcer. This is on the right great toe. With that being said he states that the Iodoflex seems to make this much more went any somewhat concerned about that. Fortunately there's no signs of worsening infection although there still malodorous drainage noted at this point. No fevers, chills, nausea, or vomiting noted at this time. He has been taking the Augmentin. 01/24/19 on evaluation today patient presents for follow-up concerning his great toe ulcer. He is been tolerating the dressing changes without complication. Unfortunately he really does not seem to be making excellent progress. He still is a deep wound him ride to come back showing that he does have osteomyelitis in the great toe on the distal aspect. Again this is something that was suspected based on examination and everything I was seeing but now confirmed. He has  previously done hyperbaric for his left foot never for the right. I do think that's an option for him for this right foot as well. He's been thinking about that but is not sure that he wants to do that again. He also was not sure whether he would prefer to see a Careers adviser. Nonetheless were still waiting to have his decision in that regard. Patient History Information obtained from Patient. Family History Diabetes - Father, Heart Disease - Father, Hypertension - Father, Kidney Disease - Father, No family history of Cancer, Lung Disease, Seizures, Stroke, Thyroid Problems, Tuberculosis. Social History AMAAR, HENCKEL ([id number]) Current every day smoker - 25 years, Marital Status - Married, Alcohol Use - Moderate, Drug Use - No History, Caffeine Use - Never. Medical History Eyes Denies history of Cataracts, Glaucoma, Optic Neuritis Ear/Nose/Mouth/Throat Denies history of Chronic sinus problems/congestion, Middle ear problems Hematologic/Lymphatic Denies history of Anemia, Hemophilia, Human Immunodeficiency Virus, Lymphedema, Sickle Cell Disease Respiratory Denies history of Aspiration, Asthma, Chronic Obstructive Pulmonary Disease (COPD), Pneumothorax, Sleep Apnea, Tuberculosis Cardiovascular Patient has history of Hypertension Denies history of Angina, Arrhythmia, Congestive Heart Failure, Coronary Artery Disease, Deep Vein Thrombosis, Hypotension, Myocardial Infarction, Peripheral Arterial Disease, Peripheral Venous Disease, Phlebitis, Vasculitis Gastrointestinal Denies history of Cirrhosis , Colitis, Crohn s, Hepatitis A, Hepatitis B, Hepatitis C Endocrine Patient has history of Type II Diabetes Denies history of Type I Diabetes Genitourinary Denies history of End Stage Renal Disease Immunological Denies history of Lupus Erythematosus, Raynaud s, Scleroderma Integumentary (Skin) Denies history of History of Burn, History of pressure wounds Musculoskeletal Patient has history of  Osteomyelitis - Left Transmet Denies history of Gout, Rheumatoid Arthritis, Osteoarthritis Neurologic Patient has history of Neuropathy - Feet Denies history of Dementia, Quadriplegia, Paraplegia, Seizure Disorder Oncologic Denies history of Received Chemotherapy, Received Radiation Psychiatric Denies history of Anorexia/bulimia, Confinement Anxiety Hospitalization/Surgery History - 03/05/2018, [facility], transmet amputation. Review of Systems (ROS) Constitutional Symptoms (General Health) Denies complaints or symptoms of Fever, Chills. Respiratory The patient has no complaints or symptoms. Cardiovascular The patient has no complaints or symptoms.  Psychiatric The patient has no complaints or symptoms. Keith Roach, Keith M. ([id number]) Objective Constitutional Well-nourished and well-hydrated in no acute distress. Vitals Time Taken: 8:09 AM, Height: 76 in, Weight: 300.4 lbs, BMI: 36.6, Temperature: 98.6 F, Pulse: 53 bpm, Respiratory Rate: 18 breaths/min, Blood Pressure: 141/74 mmHg. Respiratory normal breathing without difficulty. clear to auscultation bilaterally. Cardiovascular regular rate and rhythm with normal S1, S2. Psychiatric this patient is able to make decisions and demonstrates good insight into disease process. Alert and Oriented x 3. pleasant and cooperative. General Notes: Patient's wound bed currently shows signs of necrotic tissue in the service of the wound unfortunately. This did require sharp debridement today which was tolerated without complication. Post debridement wound bed appears to be doing much better although he still has a significant issue at this time. I'm gonna recommend that we go ahead and continue with the silver alginate dressing I think this is the best thing for him at this point. Integumentary (Hair, Skin) Wound #1 status is Open. Original cause of wound was Gradually Appeared. The wound is located on the Right Toe Great. The wound measures 0.9cm  length x 0.9cm width x 0.9cm depth; 0.636cm^2 area and 0.573cm^3 volume. There is tendon and Fat Layer (Subcutaneous Tissue) Exposed exposed. There is no tunneling or undermining noted. There is a medium amount of sanguinous drainage noted. Foul odor after cleansing was noted. The wound margin is thickened. There is no granulation within the wound bed. There is a large (67-100%) amount of necrotic tissue within the wound bed including Adherent Slough. The periwound skin appearance exhibited: Callus, Dry/Scaly, Erythema. The periwound skin appearance did not exhibit: Crepitus, Excoriation, Induration, Rash, Scarring, Maceration, Atrophie Blanche, Cyanosis, Ecchymosis, Hemosiderin Staining, Mottled, Pallor, Rubor. The surrounding wound skin color is noted with erythema. Periwound temperature was noted as No Abnormality. Assessment Active Problems ICD-10 Type 2 diabetes mellitus with foot ulcer Non-pressure chronic ulcer of other part of right foot with fat layer exposed Essential (primary) hypertension Type 2 diabetes mellitus with diabetic autonomic (poly)neuropathy Procedures Wound #1 Keith Roach, Keith Roach ([id number]) Pre-procedure diagnosis of Wound #1 is a Diabetic Wound/Ulcer of the Lower Extremity located on the Right Toe Great .Severity of Tissue Pre Debridement is: Other severity specified. There was a Excisional Skin/Subcutaneous Tissue/Muscle Debridement with a total area of 0.81 sq cm performed by STONE III, HOYT E., PA-C. With the following instrument(s): Curette to remove Viable and Non-Viable tissue/material. Material removed includes Tendon, Callus, Subcutaneous Tissue, and Slough after achieving pain control using Lidocaine. A time out was conducted at 08:34, prior to the start of the procedure. A Moderate amount of bleeding was controlled with Pressure. The procedure was tolerated well. Post Debridement Measurements: 0.9cm length x 0.9cm width x 1cm depth; 0.636cm^3  volume. Character of Wound/Ulcer Post Debridement is stable. Severity of Tissue Post Debridement is: Other severity specified. Post procedure Diagnosis Wound #1: Same as Pre-Procedure General Notes: Tendon exposed. Plan Wound Cleansing: Wound #1 Right Toe Great: Clean wound with Normal Saline. May shower with protection. Anesthetic (add to Medication List): Wound #1 Right Toe Great: Topical Lidocaine 4% cream applied to wound bed prior to debridement (In Clinic Only). Primary Wound Dressing: Wound #1 Right Toe Great: Gentamicin Sulfate Cream - to wound bed Silver Alginate - over gentamicin cream Secondary Dressing: Wound #1 Right Toe Great: Conform/Kerlix Foam Dressing Change Frequency: Wound #1 Right Toe Great: Change dressing every other day. Follow-up Appointments: Wound #1 Right Toe Great: Return Appointment in 1 week. Off-Loading: Wound #1 Right  Toe Great: Open toe surgical shoe with peg assist. Hyperbaric Oxygen Therapy: Wound #1 Right Toe Great: Evaluate for HBO Therapy Indication: - osteomyelitis If appropriate for treatment, begin HBOT per protocol: 2.0 ATA for 90 Minutes without Air Breaks One treatment per day (delivered Monday through Friday unless otherwise specified in Special Instructions below): Total # of Treatments: - 40 Finger stick Blood Glucose Pre- and Post- HBOT Treatment. Follow Hyperbaric Oxygen Glycemia Protocol Medications-please add to medication list.: Wound #1 Right Toe Great: P.O. Antibiotics Consults ordered were: Infectious Disease The following medication(s) was prescribed: Augmentin oral 875 mg-125 mg tablet 1 1 tablet oral taken 2 times a day for 14 days starting 01/24/2019 Keith Roach, HAMME. ([id number]) My suggestion is gonna be that we go ahead and send in a referral for infectious disease due to the osteomyelitis of the toe and the fact that this still is not being optimally controlled with the oral Augmentin. I am going to refill  that for him however in the interim until he gets in to see infectious disease patients in agreement that plan. We will see were things stand at follow- up. In the meantime hopefully get in with infectious disease sooner rather than later as far as that's concerned. Follow point Please see above for specific wound care orders. We will see patient for re-evaluation in 1 week(s) here in the clinic. If anything worsens or changes patient will contact our office for additional recommendations. Electronic Signature(s) Signed: 01/31/2019 9:19:49 AM By: Lenda Kelp PA-C Entered By: Lenda Kelp on 01/24/2019 13:35:29 Keith Roach ([id number]) -------------------------------------------------------------------------------- ROS/PFSH Details Patient Name: Keith Roach. Date of Service: 01/24/2019 8:00 AM Medical Record Number: [id number] Patient Account Number: [id number] Date of Birth/Sex: 12/06/1975 (43 y.o. Male) Treating RN: Arnette Norris Primary Care Provider: Scarlett Presto Other Clinician: Referring Provider: Scarlett Presto Treating Provider/Extender: Linwood Dibbles, HOYT Weeks in Treatment: 4 Information Obtained From Patient Wound History Do you currently have one or more open woundso Yes How many open wounds do you currently haveo 1 Approximately how long have you had your woundso 12/24/2018 Has your wound(s) ever healed and then re-openedo No Have you had any lab work done in the past montho No Have you tested positive for an antibiotic resistant organism (MRSA, VRE)o No Have you tested positive for osteomyelitis (bone infection)o Yes Date: 03/05/2018 Have you had any tests for circulation on your legso Yes Where was the test [facility] Have you had other problems associated with your woundso Infection Constitutional Symptoms (General Health) Complaints and Symptoms: Negative for: Fever; Chills Eyes Medical History: Negative for: Cataracts; Glaucoma; Optic  Neuritis Ear/Nose/Mouth/Throat Medical History: Negative for: Chronic sinus problems/congestion; Middle ear problems Hematologic/Lymphatic Medical History: Negative for: Anemia; Hemophilia; Human Immunodeficiency Virus; Lymphedema; Sickle Cell Disease Respiratory Complaints and Symptoms: No Complaints or Symptoms Medical History: Negative for: Aspiration; Asthma; Chronic Obstructive Pulmonary Disease (COPD); Pneumothorax; Sleep Apnea; Tuberculosis Cardiovascular Complaints and Symptoms: No Complaints or Symptoms Keith Roach, WELTY. ([id number]) Medical History: Positive for: Hypertension Negative for: Angina; Arrhythmia; Congestive Heart Failure; Coronary Artery Disease; Deep Vein Thrombosis; Hypotension; Myocardial Infarction; Peripheral Arterial Disease; Peripheral Venous Disease; Phlebitis; Vasculitis Gastrointestinal Medical History: Negative for: Cirrhosis ; Colitis; Crohnos; Hepatitis A; Hepatitis B; Hepatitis C Endocrine Medical History: Positive for: Type II Diabetes Negative for: Type I Diabetes Time with diabetes: 2015 Treated with: Oral agents Blood sugar tested every day: Yes Tested : 2 Genitourinary Medical History: Negative for: End Stage Renal Disease Immunological Medical History: Negative for:  Lupus Erythematosus; Raynaudos; Scleroderma Integumentary (Skin) Medical History: Negative for: History of Burn; History of pressure wounds Musculoskeletal Medical History: Positive for: Osteomyelitis - Left Transmet Negative for: Gout; Rheumatoid Arthritis; Osteoarthritis Neurologic Medical History: Positive for: Neuropathy - Feet Negative for: Dementia; Quadriplegia; Paraplegia; Seizure Disorder Oncologic Medical History: Negative for: Received Chemotherapy; Received Radiation Psychiatric Complaints and Symptoms: No Complaints or Symptoms Medical History: Negative for: Anorexia/bulimia; Confinement Anxiety Keith Roach, LITKE  ([id number]) Immunizations Pneumococcal Vaccine: Received Pneumococcal Vaccination: No Implantable Devices Hospitalization / Surgery History Name of Hospital Purpose of Hospitalization/Surgery Date [facility] transmet amputation 03/05/2018 Family and Social History Cancer: No; Diabetes: Yes - Father; Heart Disease: Yes - Father; Hypertension: Yes - Father; Kidney Disease: Yes - Father; Lung Disease: No; Seizures: No; Stroke: No; Thyroid Problems: No; Tuberculosis: No; Current every day smoker - 25 years; Marital Status - Married; Alcohol Use: Moderate; Drug Use: No History; Caffeine Use: Never; Advanced Directives: No; Patient does not want information on Advanced Directives; Do not resuscitate: No; Living Will: No; Medical Power of Attorney: No Physician Affirmation I have reviewed and agree with the above information. Electronic Signature(s) Signed: 01/29/2019 11:13:25 AM By: Arnette Norris Signed: 01/31/2019 9:19:49 AM By: Lenda Kelp PA-C Entered By: Lenda Kelp on 01/24/2019 13:34:37 Keith Roach ([id number]) -------------------------------------------------------------------------------- SuperBill Details Patient Name: Keith Roach. Date of Service: 01/24/2019 Medical Record Number: [id number] Patient Account Number: [id number] Date of Birth/Sex: 31-Jul-1976 (43 y.o. Male) Treating RN: Arnette Norris Primary Care Provider: Scarlett Presto Other Clinician: Referring Provider: Scarlett Presto Treating Provider/Extender: Linwood Dibbles, HOYT Weeks in Treatment: 4 Diagnosis Coding ICD-10 Codes Code Description E11.621 Type 2 diabetes mellitus with foot ulcer L97.512 Non-pressure chronic ulcer of other part of right foot with fat layer exposed I10 Essential (primary) hypertension E11.43 Type 2 diabetes mellitus with diabetic autonomic (poly)neuropathy Facility Procedures CPT4 Code: 08657846 Description: 11043 - DEB MUSC/FASCIA 20 SQ CM/< ICD-10 Diagnosis Description L97.512  Non-pressure chronic ulcer of other part of right foot with fat Modifier: layer exposed Quantity: 1 Physician Procedures CPT4 Code: 9629528 Description: 99204 - WC PHYS LEVEL 4 - NEW PT ICD-10 Diagnosis Description E11.621 Type 2 diabetes mellitus with foot ulcer L97.512 Non-pressure chronic ulcer of other part of right foot with fat I10 Essential (primary) hypertension E11.43 Type 2  diabetes mellitus with diabetic autonomic (poly)neuropath Modifier: 25 layer exposed y Quantity: 1 CPT4 Code: [id number] Description: 11043 - WC PHYS DEBR MUSCLE/FASCIA 20 SQ CM ICD-10 Diagnosis Description L97.512 Non-pressure chronic ulcer of other part of right foot with fat Modifier: layer exposed Quantity: 1 Electronic Signature(s) Signed: 01/31/2019 9:19:49 AM By: Lenda Kelp PA-C Entered By: Lenda Kelp on 01/24/2019 13:35:46

## 2019-02-11 NOTE — Progress Notes (Signed)
PANKAJ, HAACK ([id number]) Visit Report for 01/03/2019 Chief Complaint Document Details Patient Name: Keith Roach, Keith Roach. Date of Service: 01/03/2019 8:15 AM Medical Record Number: [id number] Patient Account Number: [id number] Date of Birth/Sex: 10-Mar-1976 (43 y.o. Male) Treating RN: Arnette Norris Primary Care Provider: Scarlett Presto Other Clinician: Referring Provider: Scarlett Presto Treating Provider/Extender: Linwood Dibbles, Marybel Alcott Weeks in Treatment: 1 Information Obtained from: Patient Chief Complaint Right foot ulcer Electronic Signature(s) Signed: 01/03/2019 9:52:35 PM By: Lenda Kelp PA-C Entered By: Lenda Kelp on 01/03/2019 08:19:29 Keith Roach ([id number]) -------------------------------------------------------------------------------- Debridement Details Patient Name: Keith Roach. Date of Service: 01/03/2019 8:15 AM Medical Record Number: [id number] Patient Account Number: [id number] Date of Birth/Sex: 05/28/1976 (43 y.o. Male) Treating RN: Arnette Norris Primary Care Provider: Scarlett Presto Other Clinician: Referring Provider: Scarlett Presto Treating Provider/Extender: Linwood Dibbles, Alaija Ruble Weeks in Treatment: 1 Debridement Performed for Wound #1 Right Toe Great Assessment: Performed By: Physician STONE III, Conall Vangorder E., PA-C Debridement Type: Debridement Severity of Tissue Pre Fat layer exposed Debridement: Level of Consciousness (Pre- Awake and Alert procedure): Pre-procedure Verification/Time Yes - 08:42 Out Taken: Start Time: 08:42 Pain Control: Lidocaine Total Area Debrided (L x W): 0.9 (cm) x 0.7 (cm) = 0.63 (cm) Tissue and other material Viable, Non-Viable, Slough, Subcutaneous, Slough debrided: Level: Skin/Subcutaneous Tissue Debridement Description: Excisional Instrument: Curette Bleeding: Minimum Hemostasis Achieved: Pressure End Time: 08:49 Procedural Pain: 0 Post Procedural Pain: 0 Response to Treatment: Procedure was tolerated  well Level of Consciousness Awake and Alert (Post-procedure): Post Debridement Measurements of Total Wound Length: (cm) 0.9 Width: (cm) 0.7 Depth: (cm) 0.8 Volume: (cm) 0.396 Character of Wound/Ulcer Post Debridement: Improved Severity of Tissue Post Debridement: Fat layer exposed Post Procedure Diagnosis Same as Pre-procedure Electronic Signature(s) Signed: 01/03/2019 9:52:35 PM By: Lenda Kelp PA-C Signed: 02/11/2019 10:53:34 AM By: Arnette Norris Entered By: Lenda Kelp on 01/03/2019 09:04:22 Keith Roach ([id number]) -------------------------------------------------------------------------------- HPI Details Patient Name: Keith Roach. Date of Service: 01/03/2019 8:15 AM Medical Record Number: [id number] Patient Account Number: [id number] Date of Birth/Sex: March 28, 1976 (43 y.o. Male) Treating RN: Arnette Norris Primary Care Provider: Scarlett Presto Other Clinician: Referring Provider: Scarlett Presto Treating Provider/Extender: Linwood Dibbles, Scott Fix Weeks in Treatment: 1 History of Present Illness HPI Description: 12/27/18 on evaluation today patient presents for initial inspection in our clinic during issues that he is having with his right great toe. 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 He is a current smoker and his blood sugar from between 150 and 170 although he does not have a recent A1c. He does have neuropathy and has no feeling in his feet. His ABI's today were noncompressible on the right 1.11 on the left. He does have a history of hypertension along with the type II diabetes. He tells me that in the beginning he felt like this was more of a blister over his toe where he had a callous. Subsequently in the past several days especially the past week it became obvious that he was having bleeding from the site. He has not really going anywhere to have this evaluated he just been taking care of it for the most  part on his own. With that being said no one has done the x-rays previously seen podiatry. No fevers, chills, nausea, or vomiting noted at this time. He does have a significant amount of callous buildup over the right first toe. 01/03/19 on evaluation today patient appears to be the doing okay  in regard to his right great toe. We did have an x-ray performed which was negative for obvious signs of acute osteomyelitis. With that being said the tissue in the base of the wound still appears to be necrotic on evaluation today and there is bone exposed at the base of the wound. This has me concerned nonetheless with the possibility of active infection with osteomyelitis. This was discussed with the patient today. 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 Nonetheless it looks as if treatment with Levaquin may be a very good option for him at this point. The toe was still very erythematous today but necrotic tissue in the base of the wound and a malodorous smell to the wound bed. No fevers, chills, nausea, or vomiting noted at this time. Electronic Signature(s) Signed: 01/03/2019 9:52:35 PM By: Lenda Kelp PA-C Entered By: Lenda Kelp on 01/03/2019 08:58:26 Keith Roach ([id number]) -------------------------------------------------------------------------------- Physical Exam Details Patient Name: Keith Roach, Keith Roach. Date of Service: 01/03/2019 8:15 AM Medical Record Number: [id number] Patient Account Number: [id number] Date of Birth/Sex: 03/11/1976 (43 y.o. Male) Treating RN: Arnette Norris Primary Care Provider: Scarlett Presto Other Clinician: Referring Provider: Scarlett Presto Treating Provider/Extender: STONE III, Atlee Villers Weeks in Treatment: 1 Constitutional Well-nourished and well-hydrated in no acute distress. Respiratory normal breathing without difficulty. clear to auscultation  bilaterally. Cardiovascular regular rate and rhythm with normal S1, S2. Psychiatric this patient is able to make decisions and demonstrates good insight into disease process. Alert and Oriented x 3. pleasant and cooperative. Notes Patient's wound did require debridement today and I was able to remove a significant portion of the impact tissue from the surface of the wound. With that being said post debridement it does appear that this extends down to bone although I did not note any obvious necrotic bone that could be debrided away. Subsequently there was some bleeding but again I'm still concerned about necrotic tissue in the base of the wound which I was not able to completely clear and the fact that this is such a deep wound has me suspicious for osteomyelitis. Again it would be better to know then to continue wondering. Therefore I'm gonna order an MRI. Electronic Signature(s) Signed: 01/03/2019 9:52:35 PM By: Lenda Kelp PA-C Entered By: Lenda Kelp on 01/03/2019 08:59:18 Keith Roach ([id number]) -------------------------------------------------------------------------------- Physician Orders Details Patient Name: Keith Roach, Keith Roach. Date of Service: 01/03/2019 8:15 AM Medical Record Number: [id number] Patient Account Number: [id number] Date of Birth/Sex: 03-01-1976 (43 y.o. Male) Treating RN: Arnette Norris Primary Care Provider: Scarlett Presto Other Clinician: Referring Provider: Scarlett Presto Treating Provider/Extender: Linwood Dibbles, Aniesha Haughn Weeks in Treatment: 1 Verbal / Phone Orders: No Diagnosis Coding ICD-10 Coding Code Description E11.621 Type 2 diabetes mellitus with foot ulcer L97.512 Non-pressure chronic ulcer of other part of right foot with fat layer exposed I10 Essential (primary) hypertension E11.43 Type 2 diabetes mellitus with diabetic autonomic (poly)neuropathy Wound Cleansing Wound #1 Right Toe Great o Clean wound with Normal Saline. o May shower  with protection. Anesthetic (add to Medication List) Wound #1 Right Toe Great o Topical Lidocaine 4% cream applied to wound bed prior to debridement (In Clinic Only). Primary Wound Dressing Wound #1 Right Toe Great o Iodoflex Secondary Dressing Wound #1 Right Toe Great o Conform/Kerlix o Foam Dressing Change Frequency Wound #1 Right Toe Great o Change dressing every other day. Follow-up Appointments Wound #1 Right Toe Great o Return Appointment in 1 week.  Medications-please add to medication list. Wound #1 Right Toe Great o P.O. Antibiotics - Levaquin 14 days Radiology o MRI, lower extremity with contast - With and without DERRY, [place] ([id number]) Patient Medications Allergies: No Known Drug Allergies Notifications Medication Indication Start End Augmentin 01/03/2019 DOSE 1 - oral 875 mg-125 mg tablet - 1 tablet oral taken 2 times a day for 15 days Electronic Signature(s) Signed: 01/03/2019 9:52:35 PM By: Lenda Kelp PA-C Signed: 02/11/2019 10:53:34 AM By: Arnette Norris Previous Signature: 01/03/2019 9:02:55 AM Version By: Lenda Kelp PA-C Entered By: Arnette Norris on 01/03/2019 12:40:39 Keith Roach ([id number]) -------------------------------------------------------------------------------- Problem List Details Patient Name: Keith Roach. Date of Service: 01/03/2019 8:15 AM Medical Record Number: [id number] Patient Account Number: [id number] Date of Birth/Sex: 01/26/1976 (43 y.o. Male) Treating RN: Arnette Norris Primary Care Provider: Scarlett Presto Other Clinician: Referring Provider: Scarlett Presto Treating Provider/Extender: Linwood Dibbles, Julis Haubner Weeks in Treatment: 1 Active Problems ICD-10 Evaluated Encounter Code Description Active Date Today Diagnosis E11.621 Type 2 diabetes mellitus with foot ulcer 12/27/2018 No Yes L97.512 Non-pressure chronic ulcer of other part of right foot with fat 12/27/2018 No Yes layer exposed I10  Essential (primary) hypertension 12/27/2018 No Yes E11.43 Type 2 diabetes mellitus with diabetic autonomic (poly) 12/27/2018 No Yes neuropathy Inactive Problems Resolved Problems Electronic Signature(s) Signed: 01/03/2019 9:52:35 PM By: Lenda Kelp PA-C Entered By: Lenda Kelp on 01/03/2019 08:19:25 Keith Roach ([id number]) -------------------------------------------------------------------------------- Progress Note Details Patient Name: Keith Roach. Date of Service: 01/03/2019 8:15 AM Medical Record Number: [id number] Patient Account Number: [id number] Date of Birth/Sex: Oct 08, 1976 (43 y.o. Male) Treating RN: Arnette Norris Primary Care Provider: Scarlett Presto Other Clinician: Referring Provider: Scarlett Presto Treating Provider/Extender: Linwood Dibbles, Tiler Brandis Weeks in Treatment: 1 Subjective Chief Complaint Information obtained from Patient Right foot ulcer History of Present Illness (HPI) 12/27/18 on evaluation today patient presents for initial inspection in our clinic during issues that he is having with his right great toe. 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 He is a current smoker and his blood sugar from between 150 and 170 although he does not have a recent A1c. He does have neuropathy and has no feeling in his feet. His ABI's today were noncompressible on the right 1.11 on the left. He does have a history of hypertension along with the type II diabetes. He tells me that in the beginning he felt like this was more of a blister over his toe where he had a callous. Subsequently in the past several days especially the past week it became obvious that he was having bleeding from the site. He has not really going anywhere to have this evaluated he just been taking care of it for the most part on his own. With that being said no one has done the x-rays previously seen podiatry. No fevers, chills, nausea, or vomiting  noted at this time. He does have a significant amount of callous buildup over the right first toe. 01/03/19 on evaluation today patient appears to be the doing okay in regard to his right great toe. We did have an x-ray performed which was negative for obvious signs of acute osteomyelitis. With that being said the tissue in the base of the wound still appears to be necrotic on evaluation today and there is bone exposed at the base of the wound. This has me concerned nonetheless with the possibility of active infection with osteomyelitis. This was discussed  with the patient today. 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 Nonetheless it looks as if treatment with Levaquin may be a very good option for him at this point. The toe was still very erythematous today but necrotic tissue in the base of the wound and a malodorous smell to the wound bed. No fevers, chills, nausea, or vomiting noted at this time. Patient History Information obtained from Patient. Family History Diabetes - Father, Heart Disease - Father, Hypertension - Father, Kidney Disease - Father, No family history of Cancer, Lung Disease, Seizures, Stroke, Thyroid Problems, Tuberculosis. Social History Current every day smoker - 25 years, Marital Status - Married, Alcohol Use - Moderate, Drug Use - No History, Caffeine Use - Never. Medical History Eyes Denies history of Cataracts, Glaucoma, Optic Neuritis Ear/Nose/Mouth/Throat Denies history of Chronic sinus problems/congestion, Middle ear problems Hematologic/Lymphatic Denies history of Anemia, Hemophilia, Human Immunodeficiency Virus, Lymphedema, Sickle Cell Disease Respiratory Denies history of Aspiration, Asthma, Chronic Obstructive Pulmonary Disease (COPD), Pneumothorax, Sleep Apnea, Tuberculosis Keith Roach, Keith Roach ([id number]) Cardiovascular Patient has history of  Hypertension Denies history of Angina, Arrhythmia, Congestive Heart Failure, Coronary Artery Disease, Deep Vein Thrombosis, Hypotension, Myocardial Infarction, Peripheral Arterial Disease, Peripheral Venous Disease, Phlebitis, Vasculitis Gastrointestinal Denies history of Cirrhosis , Colitis, Crohn s, Hepatitis A, Hepatitis B, Hepatitis C Endocrine Patient has history of Type II Diabetes Denies history of Type I Diabetes Genitourinary Denies history of End Stage Renal Disease Immunological Denies history of Lupus Erythematosus, Raynaud s, Scleroderma Integumentary (Skin) Denies history of History of Burn, History of pressure wounds Musculoskeletal Patient has history of Osteomyelitis - Left Transmet Denies history of Gout, Rheumatoid Arthritis, Osteoarthritis Neurologic Patient has history of Neuropathy - Feet Denies history of Dementia, Quadriplegia, Paraplegia, Seizure Disorder Oncologic Denies history of Received Chemotherapy, Received Radiation Psychiatric Denies history of Anorexia/bulimia, Confinement Anxiety Hospitalization/Surgery History - 03/05/2018, [facility], transmet amputation. Review of Systems (ROS) Constitutional Symptoms (General Health) Denies complaints or symptoms of Fever, Chills. Respiratory The patient has no complaints or symptoms. Cardiovascular The patient has no complaints or symptoms. Psychiatric The patient has no complaints or symptoms. Objective Constitutional Well-nourished and well-hydrated in no acute distress. Vitals Time Taken: 8:11 AM, Height: 76 in, Weight: 300.4 lbs, BMI: 36.6, Temperature: 98.2 F, Pulse: 96 bpm, Respiratory Rate: 16 breaths/min, Blood Pressure: 156/80 mmHg. Respiratory normal breathing without difficulty. clear to auscultation bilaterally. Cardiovascular regular rate and rhythm with normal S1, S2. Keith Roach, MOTT. ([id number]) Psychiatric this patient is able to make decisions and demonstrates good insight into disease  process. Alert and Oriented x 3. pleasant and cooperative. General Notes: Patient's wound did require debridement today and I was able to remove a significant portion of the impact tissue from the surface of the wound. With that being said post debridement it does appear that this extends down to bone although I did not note any obvious necrotic bone that could be debrided away. Subsequently there was some bleeding but again I'm still concerned about necrotic tissue in the base of the wound which I was not able to completely clear and the fact that this is such a deep wound has me suspicious for osteomyelitis. Again it would be better to know then to continue wondering. Therefore I'm gonna order an MRI. Integumentary (Hair, Skin) Wound #1 status is Open. Original cause of wound was Gradually Appeared. The wound is located on the Right Toe Great. The wound measures 0.9cm length x 0.7cm width x  0.8cm depth; 0.495cm^2 area and 0.396cm^3 volume. There is Fat Layer (Subcutaneous Tissue) Exposed exposed. There is no tunneling or undermining noted. There is a medium amount of sanguinous drainage noted. Foul odor after cleansing was noted. The wound margin is thickened. There is no granulation within the wound bed. There is a large (67-100%) amount of necrotic tissue within the wound bed including Adherent Slough. The periwound skin appearance exhibited: Callus, Dry/Scaly, Erythema. The periwound skin appearance did not exhibit: Crepitus, Excoriation, Induration, Rash, Scarring, Maceration, Atrophie Blanche, Cyanosis, Ecchymosis, Hemosiderin Staining, Mottled, Pallor, Rubor. The surrounding wound skin color is noted with erythema. Periwound temperature was noted as No Abnormality. Assessment Active Problems ICD-10 Type 2 diabetes mellitus with foot ulcer Non-pressure chronic ulcer of other part of right foot with fat layer exposed Essential (primary) hypertension Type 2 diabetes mellitus with  diabetic autonomic (poly)neuropathy Procedures Wound #1 Pre-procedure diagnosis of Wound #1 is a Diabetic Wound/Ulcer of the Lower Extremity located on the Right Toe Great .Severity of Tissue Pre Debridement is: Fat layer exposed. There was a Excisional Skin/Subcutaneous Tissue Debridement with a total area of 0.63 sq cm performed by STONE III, Kynadee Dam E., PA-C. With the following instrument(s): Curette to remove Viable and Non-Viable tissue/material. Material removed includes Subcutaneous Tissue and Slough and after achieving pain control using Lidocaine. No specimens were taken. A time out was conducted at 08:42, prior to the start of the procedure. A Minimum amount of bleeding was controlled with Pressure. The procedure was tolerated well with a pain level of 0 throughout and a pain level of 0 following the procedure. Post Debridement Measurements: 0.9cm length x 0.7cm width x 0.8cm depth; 0.396cm^3 volume. Character of Wound/Ulcer Post Debridement is improved. Severity of Tissue Post Debridement is: Fat layer exposed. Post procedure Diagnosis Wound #1: Same as Pre-Procedure Keith Roach, Keith Roach. ([id number]) Plan Wound Cleansing: Wound #1 Right Toe Great: Clean wound with Normal Saline. May shower with protection. Anesthetic (add to Medication List): Wound #1 Right Toe Great: Topical Lidocaine 4% cream applied to wound bed prior to debridement (In Clinic Only). Primary Wound Dressing: Wound #1 Right Toe Great: Iodoflex Secondary Dressing: Wound #1 Right Toe Great: Conform/Kerlix Foam Dressing Change Frequency: Wound #1 Right Toe Great: Change dressing every other day. Follow-up Appointments: Wound #1 Right Toe Great: Return Appointment in 1 week. Medications-please add to medication list.: Wound #1 Right Toe Great: P.O. Antibiotics - Levaquin 14 days The following medication(s) was prescribed: Augmentin oral 875 mg-125 mg tablet 1 1 tablet oral taken 2 times a day for 15 days  starting 01/03/2019 This point I would recommend that we go ahead and proceed with ordering him or I of the right great toe the patient is in agreement with that plan. Subsequently we are going to see were things stand at follow-up. In the meantime am gonna switch into Augmentin and have them discontinue the doxycycline at this point. Hopefully this will be better for him. In the meantime we will see what the results of the MRI show and will see him back for reevaluation in one weeks time. Please see above for specific wound care orders. We will see patient for re-evaluation in 1 week(s) here in the clinic. If anything worsens or changes patient will contact our office for additional recommendations. I did not prescribe the Levaquin as was discussed due to potential interactions with his diabetes medication. Electronic Signature(s) Signed: 01/03/2019 9:52:35 PM By: Lenda Kelp PA-C Entered By: Lenda Kelp on 01/03/2019 09:04:32 Keith Roach,  Keith Roach ([id number]) -------------------------------------------------------------------------------- ROS/PFSH Details Patient Name: Keith Roach, OMLOR. Date of Service: 01/03/2019 8:15 AM Medical Record Number: [id number] Patient Account Number: [id number] Date of Birth/Sex: [birth year] (43 y.o. Male) Treating RN: Arnette Norris Primary Care Provider: Scarlett Presto Other Clinician: Referring Provider: Scarlett Presto Treating Provider/Extender: Linwood Dibbles, Loucile Posner Weeks in Treatment: 1 Information Obtained From Patient Wound History Do you currently have one or more open woundso Yes How many open wounds do you currently haveo 1 Approximately how long have you had your woundso 12/24/2018 Has your wound(s) ever healed and then re-openedo No Have you had any lab work done in the past montho No Have you tested positive for an antibiotic resistant organism (MRSA, VRE)o No Have you tested positive for osteomyelitis (bone infection)o Yes Date: 03/05/2018 Have  you had any tests for circulation on your legso Yes Where was the test [facility] Have you had other problems associated with your woundso Infection Constitutional Symptoms (General Health) Complaints and Symptoms: Negative for: Fever; Chills Eyes Medical History: Negative for: Cataracts; Glaucoma; Optic Neuritis Ear/Nose/Mouth/Throat Medical History: Negative for: Chronic sinus problems/congestion; Middle ear problems Hematologic/Lymphatic Medical History: Negative for: Anemia; Hemophilia; Human Immunodeficiency Virus; Lymphedema; Sickle Cell Disease Respiratory Complaints and Symptoms: No Complaints or Symptoms Medical History: Negative for: Aspiration; Asthma; Chronic Obstructive Pulmonary Disease (COPD); Pneumothorax; Sleep Apnea; Tuberculosis Cardiovascular Complaints and Symptoms: No Complaints or Symptoms Keith Roach, WEIGOLD. ([id number]) Medical History: Positive for: Hypertension Negative for: Angina; Arrhythmia; Congestive Heart Failure; Coronary Artery Disease; Deep Vein Thrombosis; Hypotension; Myocardial Infarction; Peripheral Arterial Disease; Peripheral Venous Disease; Phlebitis; Vasculitis Gastrointestinal Medical History: Negative for: Cirrhosis ; Colitis; Crohnos; Hepatitis A; Hepatitis B; Hepatitis C Endocrine Medical History: Positive for: Type II Diabetes Negative for: Type I Diabetes Time with diabetes: 2015 Treated with: Oral agents Blood sugar tested every day: Yes Tested : 2 Genitourinary Medical History: Negative for: End Stage Renal Disease Immunological Medical History: Negative for: Lupus Erythematosus; Raynaudos; Scleroderma Integumentary (Skin) Medical History: Negative for: History of Burn; History of pressure wounds Musculoskeletal Medical History: Positive for: Osteomyelitis - Left Transmet Negative for: Gout; Rheumatoid Arthritis; Osteoarthritis Neurologic Medical History: Positive for: Neuropathy - Feet Negative for:  Dementia; Quadriplegia; Paraplegia; Seizure Disorder Oncologic Medical History: Negative for: Received Chemotherapy; Received Radiation Psychiatric Complaints and Symptoms: No Complaints or Symptoms Medical History: Negative for: Anorexia/bulimia; Confinement Anxiety Keith Roach, Keith Roach ([id number]) Immunizations Pneumococcal Vaccine: Received Pneumococcal Vaccination: No Implantable Devices Hospitalization / Surgery History Name of Hospital Purpose of Hospitalization/Surgery Date [facility] transmet amputation 03/05/2018 Family and Social History Cancer: No; Diabetes: Yes - Father; Heart Disease: Yes - Father; Hypertension: Yes - Father; Kidney Disease: Yes - Father; Lung Disease: No; Seizures: No; Stroke: No; Thyroid Problems: No; Tuberculosis: No; Current every day smoker - 25 years; Marital Status - Married; Alcohol Use: Moderate; Drug Use: No History; Caffeine Use: Never; Advanced Directives: No; Patient does not want information on Advanced Directives; Do not resuscitate: No; Living Will: No; Medical Power of Attorney: No Physician Affirmation I have reviewed and agree with the above information. Electronic Signature(s) Signed: 01/03/2019 9:52:35 PM By: Lenda Kelp PA-C Signed: 02/11/2019 10:53:34 AM By: Arnette Norris Entered By: Lenda Kelp on 01/03/2019 08:58:44 Keith Roach ([id number]) -------------------------------------------------------------------------------- SuperBill Details Patient Name: Keith Roach. Date of Service: 01/03/2019 Medical Record Number: [id number] Patient Account Number: [id number] Date of Birth/Sex: [birth year] (43 y.o. 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

## 2019-02-11 NOTE — Progress Notes (Signed)
Keith Roach, Nahum M. ([id number]) Visit Report for 01/24/2019 Arrival Information Details Patient Name: Keith Roach, Keith M. Date of Service: 01/24/2019 8:00 AM Medical Record Number: [id number] Patient Account Number: [id number] Date of Birth/Sex: Jun 27, 1976 [age] y.o. Male) Treating RN: Arnette NorrisBiell, Kristina Primary Care Dayja Loveridge: Scarlett PrestoHUIN, ALEXANDRE Other Clinician: Referring Emmogene Simson: Scarlett PrestoHUIN, ALEXANDRE Treating Jaylei Fuerte/Extender: Linwood DibblesSTONE III, HOYT Weeks in Treatment: 4 Visit Information History Since Last Visit Added or deleted any medications: No Patient Arrived: Ambulatory Any new allergies or adverse reactions: No Arrival Time: 08:08 Had a fall or experienced change in No Accompanied By: self activities of daily living that may affect Transfer Assistance: None risk of falls: Signs or symptoms of abuse/neglect since last visito No Hospitalized since last visit: No Implantable device outside of the clinic excluding No cellular tissue based products placed in the center since last visit: Has Dressing in Place as Prescribed: Yes Pain Present Now: No Electronic Signature(s) Signed: 01/24/2019 3:13:56 PM By: Dayton MartesWallace, RCP,RRT,CHT, Sallie RCP, RRT, CHT Entered By: Dayton MartesWallace, RCP,RRT,CHT, Sallie on 01/24/2019 08:09:13 Keith Roach, Keith M. ([id number]) -------------------------------------------------------------------------------- Encounter Discharge Information Details Patient Name: Keith Roach, Keith M. Date of Service: 01/24/2019 8:00 AM Medical Record Number: [id number] Patient Account Number: [id number] Date of Birth/Sex: Jun 27, 1976 [age] y.o. Male) Treating RN: Arnette NorrisBiell, Kristina Primary Care Trula Frede: Scarlett PrestoHUIN, ALEXANDRE Other Clinician: Referring Connelly [place]: Scarlett PrestoHUIN, ALEXANDRE Treating Soham Hollett/Extender: Linwood DibblesSTONE III, HOYT Weeks in Treatment: 4 Encounter Discharge Information Items Post Procedure Vitals Discharge Condition: Stable Temperature (F): 98.6 Ambulatory Status: Ambulatory Pulse (bpm): 53 Discharge Destination:  Home Respiratory Rate (breaths/min): 18 Transportation: Private Auto Blood Pressure (mmHg): 141/74 Accompanied By: self Schedule Follow-up Appointment: Yes Clinical Summary of Care: Electronic Signature(s) Signed: 01/24/2019 9:05:14 AM By: Arnette NorrisBiell, Kristina Entered By: Arnette NorrisBiell, Kristina on 01/24/2019 09:05:13 Keith Roach, Ladd M. ([id number]) -------------------------------------------------------------------------------- Lower Extremity Assessment Details Patient Name: Keith Roach, Keith M. Date of Service: 01/24/2019 8:00 AM Medical Record Number: [id number] Patient Account Number: [id number] Date of Birth/Sex: Jun 27, 1976 [age] y.o. Male) Treating RN: Curtis Sitesorthy, Joanna Primary Care Desiray Orchard: Scarlett PrestoHUIN, ALEXANDRE Other Clinician: Referring Indianna Boran: Scarlett PrestoHUIN, ALEXANDRE Treating Ainslee Sou/Extender: Linwood DibblesSTONE III, HOYT Weeks in Treatment: 4 Vascular Assessment Pulses: Dorsalis Pedis Palpable: [Right:Yes] Posterior Tibial Extremity colors, hair growth, and conditions: Extremity Color: [Right:Normal] Hair Growth on Extremity: [Right:Yes] Temperature of Extremity: [Right:Warm] Capillary Refill: [Right:< 3 seconds] Toe Nail Assessment Left: Right: Thick: Yes Discolored: Yes Deformed: Yes Improper Length and Hygiene: No Electronic Signature(s) Signed: 01/24/2019 4:31:30 PM By: Curtis Sitesorthy, Joanna Entered By: Curtis Sitesorthy, Joanna on 01/24/2019 08:20:17 Keith Roach, Shell M. ([id number]) -------------------------------------------------------------------------------- Multi Wound Chart Details Patient Name: Keith Roach, Keith M. Date of Service: 01/24/2019 8:00 AM Medical Record Number: [id number] Patient Account Number: [id number] Date of Birth/Sex: Jun 27, 1976 [age] y.o. Male) Treating RN: Rodell PernaScott, Dajea Primary Care Jaziah Goeller: Scarlett PrestoHUIN, ALEXANDRE Other Clinician: Referring Markail Diekman: Scarlett PrestoHUIN, ALEXANDRE Treating Kamdyn Covel/Extender: STONE III, HOYT Weeks in Treatment: 4 Vital Signs Height(in): 76 Pulse(bpm): 53 Weight(lbs): 300.4 Blood  Pressure(mmHg): 141/74 Body Mass Index(BMI): 37 Temperature(F): 98.6 Respiratory Rate 18 (breaths/min): Photos: [1:No Photos] [N/A:N/A] Wound Location: [1:Right Toe Great] [N/A:N/A] Wounding Event: [1:Gradually Appeared] [N/A:N/A] Primary Etiology: [1:Diabetic Wound/Ulcer of the Lower Extremity] [N/A:N/A] Comorbid History: [1:Hypertension, Type II Diabetes, Osteomyelitis, Neuropathy] [N/A:N/A] Date Acquired: [1:12/17/2018] [N/A:N/A] Weeks of Treatment: [1:4] [N/A:N/A] Wound Status: [1:Open] [N/A:N/A] Pending Amputation on [1:Yes] [N/A:N/A] Presentation: Measurements L x W x D [1:0.9x0.9x0.9] [N/A:N/A] (cm) Area (cm) : [1:0.636] [N/A:N/A] Volume (cm) : [1:0.573] [N/A:N/A] % Reduction in Area: [1:-12.60%] [N/A:N/A] % Reduction in Volume: [1:15.60%] [N/A:N/A] Classification: [1:Grade 2] [N/A:N/A] Exudate Amount: [1:Medium] [N/A:N/A] Exudate Type: [1:Sanguinous] [N/A:N/A] Exudate Color: [1:red] [N/A:N/A] Foul Odor  After Cleansing: [1:Yes] [N/A:N/A] Odor Anticipated Due to [1:No] [N/A:N/A] Product Use: Wound Margin: [1:Thickened] [N/A:N/A] Granulation Amount: [1:None Present (0%)] [N/A:N/A] Necrotic Amount: [1:Large (67-100%)] [N/A:N/A] Exposed Structures: [1:Fat Layer (Subcutaneous Tissue) Exposed: Yes Tendon: Yes Fascia: No Muscle: No Joint: No Bone: No] [N/A:N/A] Epithelialization: [1:None] [N/A:N/A] Periwound Skin Texture: Callus: Yes N/A N/A Excoriation: No Induration: No Crepitus: No Rash: No Scarring: No Periwound Skin Moisture: Dry/Scaly: Yes N/A N/A Maceration: No Periwound Skin Color: Erythema: Yes N/A N/A Atrophie Blanche: No Cyanosis: No Ecchymosis: No Hemosiderin Staining: No Mottled: No Pallor: No Rubor: No Temperature: No Abnormality N/A N/A Tenderness on Palpation: No N/A N/A Wound Preparation: Ulcer Cleansing: N/A N/A Rinsed/Irrigated with Saline Topical Anesthetic Applied: Other: lidocaine 4% Treatment Notes Electronic  Signature(s) Signed: 01/25/2019 4:09:31 PM By: Rodell Perna Entered By: Rodell Perna on 01/24/2019 08:30:19 Keith Roach ([id number]) -------------------------------------------------------------------------------- Multi-Disciplinary Care Plan Details Patient Name: Keith Roach, TIBBITS. Date of Service: 01/24/2019 8:00 AM Medical Record Number: [id number] Patient Account Number: [id number] Date of Birth/Sex: 07/07/1976 (43 y.o. Male) Treating RN: Rodell Perna Primary Care Shannara Winbush: Scarlett Presto Other Clinician: Referring Elridge Stemm: Scarlett Presto Treating Racine Erby/Extender: Linwood Dibbles, HOYT Weeks in Treatment: 4 Active Inactive Electronic Signature(s) Signed: 01/31/2019 8:48:16 AM By: Elliot Gurney, BSN, RN, CWS, Kim RN, BSN Signed: 02/11/2019 11:22:39 AM By: Rodell Perna Previous Signature: 01/25/2019 4:09:31 PM Version By: Rodell Perna Entered By: Elliot Gurney BSN, RN, CWS, Kim on 01/31/2019 08:48:16 Keith Roach ([id number]) -------------------------------------------------------------------------------- Pain Assessment Details Patient Name: Keith Roach, MALLERNEE. Date of Service: 01/24/2019 8:00 AM Medical Record Number: [id number] Patient Account Number: [id number] Date of Birth/Sex: 10/19/1976 (43 y.o. Male) Treating RN: Arnette Norris Primary Care Lakechia Nay: Scarlett Presto Other Clinician: Referring Jessilyn Catino: Scarlett Presto Treating Hazem Kenner/Extender: Linwood Dibbles, HOYT Weeks in Treatment: 4 Active Problems Location of Pain Severity and Description of Pain Patient Has Paino No Site Locations Pain Management and Medication Current Pain Management: Electronic Signature(s) Signed: 01/24/2019 3:13:56 PM By: Dayton Martes RCP, RRT, CHT Signed: 01/29/2019 11:13:25 AM By: Arnette Norris Entered By: Dayton Martes on 01/24/2019 08:09:22 Keith Roach ([id number]) -------------------------------------------------------------------------------- Patient/Caregiver Education  Details Patient Name: Keith Roach. Date of Service: 01/24/2019 8:00 AM Medical Record Number: [id number] Patient Account Number: [id number] Date of Birth/Gender: 1976-08-01 (43 y.o. Male) Treating RN: Rodell Perna Primary Care Physician: Scarlett Presto Other Clinician: Referring Physician: Scarlett Presto Treating Physician/Extender: Skeet Simmer in Treatment: 4 Education Assessment Education Provided To: Patient Education Topics Provided Hyperbaric Oxygenation: Handouts: Hyperbaric Oxygen Methods: Explain/Verbal Responses: State content correctly Infection: Handouts: Other: Continue antibiotics Wound/Skin Impairment: Handouts: Caring for Your Ulcer, Other: Continue wound care as prescribed Electronic Signature(s) Signed: 01/29/2019 11:13:25 AM By: Arnette Norris Entered By: Arnette Norris on 01/24/2019 09:05:18 Keith Roach ([id number]) -------------------------------------------------------------------------------- Wound Assessment Details Patient Name: Keith Roach. Date of Service: 01/24/2019 8:00 AM Medical Record Number: [id number] Patient Account Number: [id number] Date of Birth/Sex: 05/16/1976 (42 y.o. Male) Treating RN: Curtis Sites Primary Care Daleysa Kristiansen: Scarlett Presto Other Clinician: Referring Azadeh Hyder: Scarlett Presto Treating Detrice Cales/Extender: STONE III, HOYT Weeks in Treatment: 4 Wound Status Wound Number: 1 Primary Diabetic Wound/Ulcer of the Lower Extremity Etiology: Wound Location: Right Toe Great Wound Status: Open Wounding Event: Gradually Appeared Comorbid Hypertension, Type II Diabetes, Date Acquired: 12/17/2018 History: Osteomyelitis, Neuropathy Weeks Of Treatment: 4 Clustered Wound: No Pending Amputation On Presentation Photos Photo Uploaded By: Curtis Sites on 01/24/2019 13:03:29 Wound Measurements Length: (cm) 0.9 Width: (cm) 0.9 Depth: (cm) 0.9 Area: (cm) 0.636 Volume: (cm) 0.573 % Reduction in  Area:  -12.6% % Reduction in Volume: 15.6% Epithelialization: None Tunneling: No Undermining: No Wound Description Classification: Grade 2 Wound Margin: Thickened Exudate Amount: Medium Exudate Type: Sanguinous Exudate Color: red Foul Odor After Cleansing: Yes Due to Product Use: No Slough/Fibrino Yes Wound Bed Granulation Amount: None Present (0%) Exposed Structure Necrotic Amount: Large (67-100%) Fascia Exposed: No Necrotic Quality: Adherent Slough Fat Layer (Subcutaneous Tissue) Exposed: Yes Tendon Exposed: Yes Muscle Exposed: No Joint Exposed: No Bone Exposed: No Periwound Skin Texture GREGOR, REYNER. ([id number]) Texture Color No Abnormalities Noted: No No Abnormalities Noted: No Callus: Yes Atrophie Blanche: No Crepitus: No Cyanosis: No Excoriation: No Ecchymosis: No Induration: No Erythema: Yes Rash: No Hemosiderin Staining: No Scarring: No Mottled: No Pallor: No Moisture Rubor: No No Abnormalities Noted: No Dry / Scaly: Yes Temperature / Pain Maceration: No Temperature: No Abnormality Wound Preparation Ulcer Cleansing: Rinsed/Irrigated with Saline Topical Anesthetic Applied: Other: lidocaine 4%, Electronic Signature(s) Signed: 01/24/2019 4:31:30 PM By: Curtis Sites Entered By: Curtis Sites on 01/24/2019 08:19:50 Keith Roach ([id number]) -------------------------------------------------------------------------------- Vitals Details Patient Name: Keith Roach. Date of Service: 01/24/2019 8:00 AM Medical Record Number: [id number] Patient Account Number: [id number] Date of Birth/Sex: 03/14/1976 (43 y.o. Male) Treating RN: Arnette Norris Primary Care Gaylin Bulthuis: Scarlett Presto Other Clinician: Referring Everleigh Colclasure: Scarlett Presto Treating Jennilyn Esteve/Extender: STONE III, HOYT Weeks in Treatment: 4 Vital Signs Time Taken: 08:09 Temperature (F): 98.6 Height (in): 76 Pulse (bpm): 53 Weight (lbs): 300.4 Respiratory Rate (breaths/min): 18 Body  Mass Index (BMI): 36.6 Blood Pressure (mmHg): 141/74 Reference Range: 80 - 120 mg / dl Electronic Signature(s) Signed: 01/24/2019 3:13:56 PM By: Dayton Martes RCP, RRT, CHT Entered By: Dayton Martes on 01/24/2019 08:12:00

## 2019-02-11 NOTE — Progress Notes (Signed)
Keith Roach ([id number]) Visit Report for 01/03/2019 Arrival Information Details Patient Name: Keith Roach, Keith Roach. Date of Service: 01/03/2019 8:15 AM Medical Record Number: [id number] Patient Account Number: [id number] Date of Birth/Sex: Feb 09, 1976 (43 y.o. Male) Treating RN: Arnette Norris Primary Care Neveah Bang: Scarlett Presto Other Clinician: Referring Shayli Altemose: Scarlett Presto Treating Cleota Pellerito/Extender: Linwood Dibbles, HOYT Weeks in Treatment: 1 Visit Information History Since Last Visit Added or deleted any medications: No Patient Arrived: Ambulatory Any new allergies or adverse reactions: No Arrival Time: 08:10 Had a fall or experienced change in No Accompanied By: self activities of daily living that may affect Transfer Assistance: None risk of falls: Patient Identification Verified: Yes Signs or symptoms of abuse/neglect since last visito No Secondary Verification Process Completed: Yes Hospitalized since last visit: No Implantable device outside of the clinic excluding No cellular tissue based products placed in the center since last visit: Has Dressing in Place as Prescribed: Yes Pain Present Now: No Electronic Signature(s) Signed: 01/03/2019 11:23:59 AM By: Dayton Martes RCP, RRT, CHT Entered By: Dayton Martes on 01/03/2019 08:11:21 Keith Roach ([id number]) -------------------------------------------------------------------------------- Encounter Discharge Information Details Patient Name: Keith Roach. Date of Service: 01/03/2019 8:15 AM Medical Record Number: [id number] Patient Account Number: [id number] Date of Birth/Sex: Sep 27, 1976 (43 y.o. Male) Treating RN: Huel Coventry Primary Care Ailene Royal: Scarlett Presto Other Clinician: Referring Jameisha Stofko: Scarlett Presto Treating Daziyah Cogan/Extender: Linwood Dibbles, HOYT Weeks in Treatment: 1 Encounter Discharge Information Items Post Procedure Vitals Discharge Condition: Stable Temperature  (F): 98.2 Ambulatory Status: Ambulatory Pulse (bpm): 96 Discharge Destination: Home Respiratory Rate (breaths/min): 16 Transportation: Private Auto Blood Pressure (mmHg): 156/80 Accompanied By: self Schedule Follow-up Appointment: Yes Clinical Summary of Care: Electronic Signature(s) Signed: 01/03/2019 5:40:20 PM By: Elliot Gurney, BSN, RN, CWS, Kim RN, BSN Entered By: Elliot Gurney, BSN, RN, CWS, Kim on 01/03/2019 08:54:47 Keith Roach ([id number]) -------------------------------------------------------------------------------- Lower Extremity Assessment Details Patient Name: Keith Roach, Keith Roach. Date of Service: 01/03/2019 8:15 AM Medical Record Number: [id number] Patient Account Number: [id number] Date of Birth/Sex: April 02, 1976 (43 y.o. Male) Treating RN: Curtis Sites Primary Care Tavie Haseman: Scarlett Presto Other Clinician: Referring Breanah Faddis: Scarlett Presto Treating Johann Santone/Extender: STONE III, HOYT Weeks in Treatment: 1 Edema Assessment Assessed: [Left: No] [Right: No] Edema: [Left: N] [Right: o] Vascular Assessment Pulses: Dorsalis Pedis Palpable: [Right:Yes] Posterior Tibial Extremity colors, hair growth, and conditions: Extremity Color: [Right:Normal] Hair Growth on Extremity: [Right:Yes] Temperature of Extremity: [Right:Warm] Capillary Refill: [Right:< 3 seconds] Toe Nail Assessment Left: Right: Thick: Yes Discolored: Yes Deformed: No Improper Length and Hygiene: No Electronic Signature(s) Signed: 01/03/2019 5:03:44 PM By: Curtis Sites Entered By: Curtis Sites on 01/03/2019 08:20:43 Keith Roach ([id number]) -------------------------------------------------------------------------------- Multi Wound Chart Details Patient Name: Keith Roach. Date of Service: 01/03/2019 8:15 AM Medical Record Number: [id number] Patient Account Number: [id number] Date of Birth/Sex: 1976-11-07 (43 y.o. Male) Treating RN: Arnette Norris Primary Care Ken Bonn: Scarlett Presto Other  Clinician: Referring Naija Troost: Scarlett Presto Treating Strider Vallance/Extender: STONE III, HOYT Weeks in Treatment: 1 Vital Signs Height(in): 76 Pulse(bpm): 96 Weight(lbs): 300.4 Blood Pressure(mmHg): 156/80 Body Mass Index(BMI): 37 Temperature(F): 98.2 Respiratory Rate 16 (breaths/min): Photos: [1:No Photos] [N/A:N/A] Wound Location: [1:Right Toe Great] [N/A:N/A] Wounding Event: [1:Gradually Appeared] [N/A:N/A] Primary Etiology: [1:Diabetic Wound/Ulcer of the Lower Extremity] [N/A:N/A] Comorbid History: [1:Hypertension, Type II Diabetes, Osteomyelitis, Neuropathy] [N/A:N/A] Date Acquired: [1:12/17/2018] [N/A:N/A] Weeks of Treatment: [1:1] [N/A:N/A] Wound Status: [1:Open] [N/A:N/A] Pending Amputation on [1:Yes] [N/A:N/A] Presentation: Measurements L x W x D [1:0.9x0.7x0.8] [N/A:N/A] (cm) Area (cm) : [1:0.495] [N/A:N/A] Volume (cm) : [1:0.396] [N/A:N/A] %  Reduction in Area: [1:12.40%] [N/A:N/A] % Reduction in Volume: [1:41.70%] [N/A:N/A] Classification: [1:Grade 2] [N/A:N/A] Exudate Amount: [1:Medium] [N/A:N/A] Exudate Type: [1:Sanguinous] [N/A:N/A] Exudate Color: [1:red] [N/A:N/A] Foul Odor After Cleansing: [1:Yes] [N/A:N/A] Odor Anticipated Due to [1:No] [N/A:N/A] Product Use: Wound Margin: [1:Thickened] [N/A:N/A] Granulation Amount: [1:None Present (0%)] [N/A:N/A] Necrotic Amount: [1:Large (67-100%)] [N/A:N/A] Exposed Structures: [1:Fat Layer (Subcutaneous Tissue) Exposed: Yes Fascia: No Tendon: No Muscle: No Joint: No Bone: No] [N/A:N/A] Epithelialization: [1:None] [N/A:N/A] Periwound Skin Texture: Callus: Yes N/A N/A Excoriation: No Induration: No Crepitus: No Rash: No Scarring: No Periwound Skin Moisture: Dry/Scaly: Yes N/A N/A Maceration: No Periwound Skin Color: Erythema: Yes N/A N/A Atrophie Blanche: No Cyanosis: No Ecchymosis: No Hemosiderin Staining: No Mottled: No Pallor: No Rubor: No Temperature: No Abnormality N/A N/A Tenderness on  Palpation: No N/A N/A Wound Preparation: Ulcer Cleansing: N/A N/A Rinsed/Irrigated with Saline Topical Anesthetic Applied: Other: lidocaine 4% Treatment Notes Electronic Signature(s) Signed: 02/11/2019 10:53:34 AM By: Arnette Norris Entered By: Arnette Norris on 01/03/2019 08:41:53 Keith Roach ([id number]) -------------------------------------------------------------------------------- Multi-Disciplinary Care Plan Details Patient Name: Keith Roach, Keith Roach. Date of Service: 01/03/2019 8:15 AM Medical Record Number: [id number] Patient Account Number: [id number] Date of Birth/Sex: 01-Dec-1976 (43 y.o. Male) Treating RN: Arnette Norris Primary Care Grecia Lynk: Scarlett Presto Other Clinician: Referring Ednamae Schiano: Scarlett Presto Treating Dalaysia Harms/Extender: Linwood Dibbles, HOYT Weeks in Treatment: 1 Active Inactive Abuse / Safety / Falls / Self Care Management Nursing Diagnoses: Potential for falls Goals: Patient will remain injury free related to falls Date Initiated: 12/27/2018 Target Resolution Date: 03/09/2019 Goal Status: Active Interventions: Assess fall risk on admission and as needed Notes: Necrotic Tissue Nursing Diagnoses: Impaired tissue integrity related to necrotic/devitalized tissue Goals: Necrotic/devitalized tissue will be minimized in the wound bed Date Initiated: 12/27/2018 Target Resolution Date: 03/09/2019 Goal Status: Active Interventions: Assess patient pain level pre-, during and post procedure and prior to discharge Notes: Orientation to the Wound Care Program Nursing Diagnoses: Knowledge deficit related to the wound healing center program Goals: Patient/caregiver will verbalize understanding of the Wound Healing Center Program Date Initiated: 12/27/2018 Target Resolution Date: 03/09/2019 Goal Status: Active Interventions: Provide education on orientation to the wound center [place], Earlene Plater M. ([id number]) Notes: Wound/Skin Impairment Nursing Diagnoses: Impaired  tissue integrity Goals: Ulcer/skin breakdown will heal within 14 weeks Date Initiated: 12/27/2018 Target Resolution Date: 03/09/2019 Goal Status: Active Interventions: Assess patient/caregiver ability to obtain necessary supplies Assess patient/caregiver ability to perform ulcer/skin care regimen upon admission and as needed Assess ulceration(s) every visit Notes: Electronic Signature(s) Signed: 02/11/2019 10:53:34 AM By: Arnette Norris Entered By: Arnette Norris on 01/03/2019 08:14:42 Keith Roach ([id number]) -------------------------------------------------------------------------------- Pain Assessment Details Patient Name: Keith Roach. Date of Service: 01/03/2019 8:15 AM Medical Record Number: [id number] Patient Account Number: [id number] Date of Birth/Sex: 06-Dec-1975 (43 y.o. Male) Treating RN: Arnette Norris Primary Care Deke Tilghman: Scarlett Presto Other Clinician: Referring Michelyn Scullin: Scarlett Presto Treating Rosendo Couser/Extender: Linwood Dibbles, HOYT Weeks in Treatment: 1 Active Problems Location of Pain Severity and Description of Pain Patient Has Paino No Site Locations Pain Management and Medication Current Pain Management: Electronic Signature(s) Signed: 01/03/2019 11:23:59 AM By: Dayton Martes RCP, RRT, CHT Signed: 02/11/2019 10:53:34 AM By: Arnette Norris Entered By: Dayton Martes on 01/03/2019 08:11:27 Keith Roach ([id number]) -------------------------------------------------------------------------------- Patient/Caregiver Education Details Patient Name: Keith Roach. Date of Service: 01/03/2019 8:15 AM Medical Record Number: [id number] Patient Account Number: [id number] Date of Birth/Gender: 1976-11-08 (43 y.o. Male) Treating RN: Arnette Norris Primary Care Physician: Scarlett Presto Other Clinician: Referring Physician: Scarlett Presto  Treating Physician/Extender: Linwood DibblesSTONE III, HOYT Weeks in Treatment: 1 Education  Assessment Education Provided To: Patient Education Topics Provided Wound/Skin Impairment: Handouts: Caring for Your Ulcer Methods: Demonstration, Explain/Verbal Responses: State content correctly Electronic Signature(s) Signed: 02/11/2019 10:53:34 AM By: Arnette NorrisBiell, Kristina Entered By: Arnette NorrisBiell, Kristina on 01/03/2019 08:42:18 Keith Roach, Keith M. ([id number]) -------------------------------------------------------------------------------- Wound Assessment Details Patient Name: Keith Roach, Keith M. Date of Service: 01/03/2019 8:15 AM Medical Record Number: [id number] Patient Account Number: [id number] Date of Birth/Sex: May 20, 1976 [age] y.o. Male) Treating RN: Curtis Sitesorthy, Joanna Primary Care Sloka Volante: Scarlett PrestoHUIN, ALEXANDRE Other Clinician: Referring Brittyn Salaz: Scarlett PrestoHUIN, ALEXANDRE Treating Sagrario Lineberry/Extender: STONE III, HOYT Weeks in Treatment: 1 Wound Status Wound Number: 1 Primary Diabetic Wound/Ulcer of the Lower Extremity Etiology: Wound Location: Right Toe Great Wound Status: Open Wounding Event: Gradually Appeared Comorbid Hypertension, Type II Diabetes, Date Acquired: 12/17/2018 History: Osteomyelitis, Neuropathy Weeks Of Treatment: 1 Clustered Wound: No Pending Amputation On Presentation Photos Photo Uploaded By: Curtis Sitesorthy, Joanna on 01/03/2019 09:38:14 Wound Measurements Length: (cm) 0.9 Width: (cm) 0.7 Depth: (cm) 0.8 Area: (cm) 0.495 Volume: (cm) 0.396 % Reduction in Area: 12.4% % Reduction in Volume: 41.7% Epithelialization: None Tunneling: No Undermining: No Wound Description Classification: Grade 2 Foul O Wound Margin: Thickened Due to Exudate Amount: Medium Slough Exudate Type: Sanguinous Exudate Color: red dor After Cleansing: Yes Product Use: No /Fibrino Yes Wound Bed Granulation Amount: None Present (0%) Exposed Structure Necrotic Amount: Large (67-100%) Fascia Exposed: No Necrotic Quality: Adherent Slough Fat Layer (Subcutaneous Tissue) Exposed: Yes Tendon Exposed:  No Muscle Exposed: No Joint Exposed: No Bone Exposed: No Periwound Skin Texture Keith Roach, Keith M. ([id number]) Texture Color No Abnormalities Noted: No No Abnormalities Noted: No Callus: Yes Atrophie Blanche: No Crepitus: No Cyanosis: No Excoriation: No Ecchymosis: No Induration: No Erythema: Yes Rash: No Hemosiderin Staining: No Scarring: No Mottled: No Pallor: No Moisture Rubor: No No Abnormalities Noted: No Dry / Scaly: Yes Temperature / Pain Maceration: No Temperature: No Abnormality Wound Preparation Ulcer Cleansing: Rinsed/Irrigated with Saline Topical Anesthetic Applied: Other: lidocaine 4%, Electronic Signature(s) Signed: 01/03/2019 5:03:44 PM By: Curtis Sitesorthy, Joanna Entered By: Curtis Sitesorthy, Joanna on 01/03/2019 08:20:06 Keith Roach, Keith M. ([id number]) -------------------------------------------------------------------------------- Vitals Details Patient Name: Keith Roach, Keith M. Date of Service: 01/03/2019 8:15 AM Medical Record Number: [id number] Patient Account Number: [id number] Date of Birth/Sex: May 20, 1976 [age] y.o. Male) Treating RN: Arnette NorrisBiell, Kristina Primary Care Hellen Shanley: Scarlett PrestoHUIN, ALEXANDRE Other Clinician: Referring Jionni Helming: Scarlett PrestoHUIN, ALEXANDRE Treating Alin Chavira/Extender: STONE III, HOYT Weeks in Treatment: 1 Vital Signs Time Taken: 08:11 Temperature (F): 98.2 Height (in): 76 Pulse (bpm): 96 Weight (lbs): 300.4 Respiratory Rate (breaths/min): 16 Body Mass Index (BMI): 36.6 Blood Pressure (mmHg): 156/80 Reference Range: 80 - 120 mg / dl Electronic Signature(s) Signed: 01/03/2019 11:23:59 AM By: Dayton MartesWallace, RCP,RRT,CHT, Sallie RCP, RRT, CHT Entered By: Dayton MartesWallace, RCP,RRT,CHT, Sallie on 01/03/2019 08:15:41

## 2020-10-04 IMAGING — CR DG FOOT COMPLETE 3+V*R*
3 series · 3 of 3 positions shown · non-contrast
Comparison: None.

CLINICAL DATA: Open wound great toe.

EXAM:
RIGHT FOOT COMPLETE - 3+ VIEW

[foot ap]
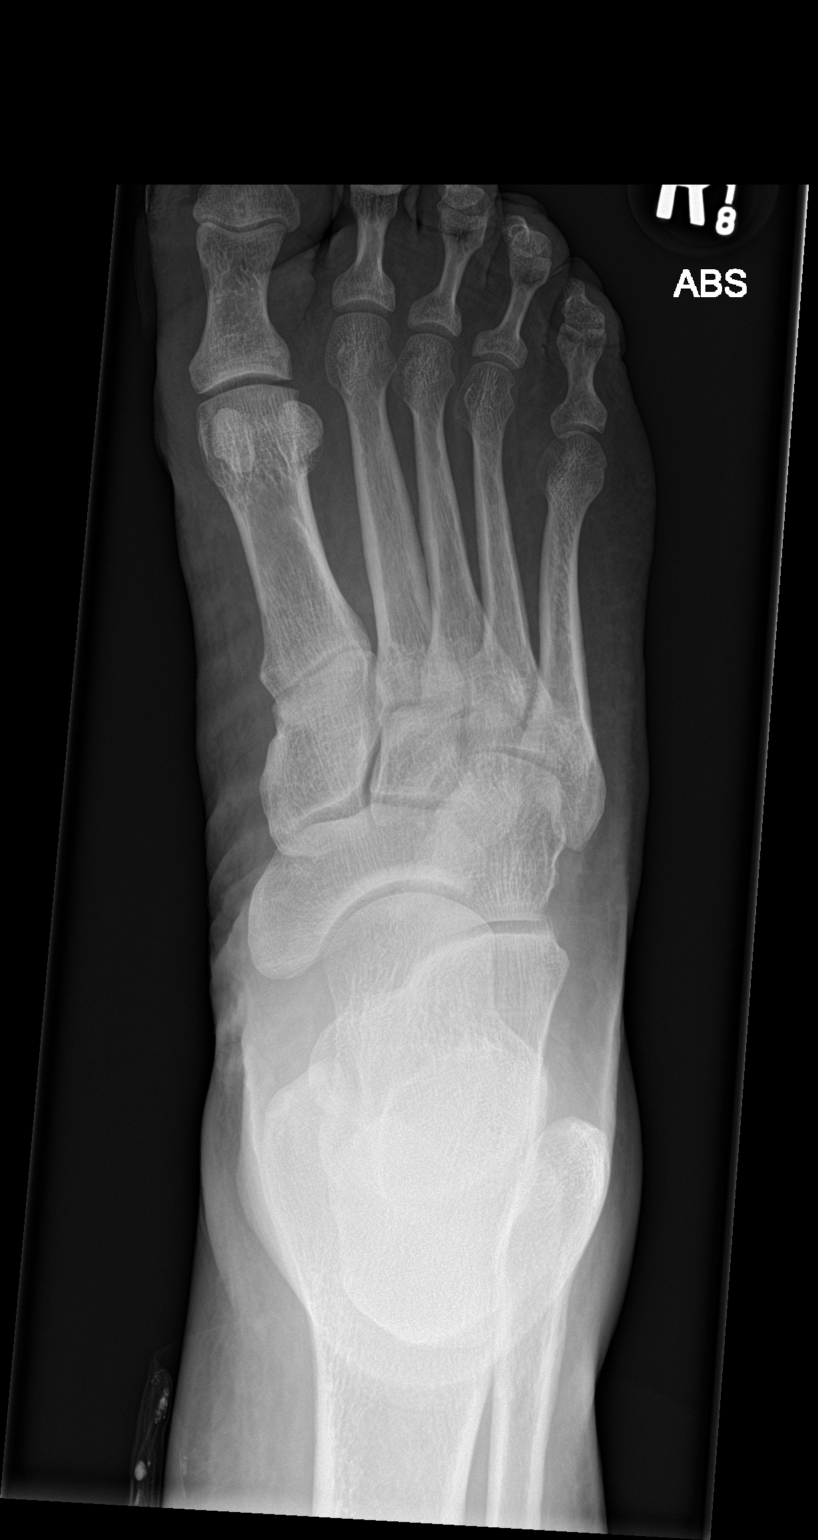

[foot obl]
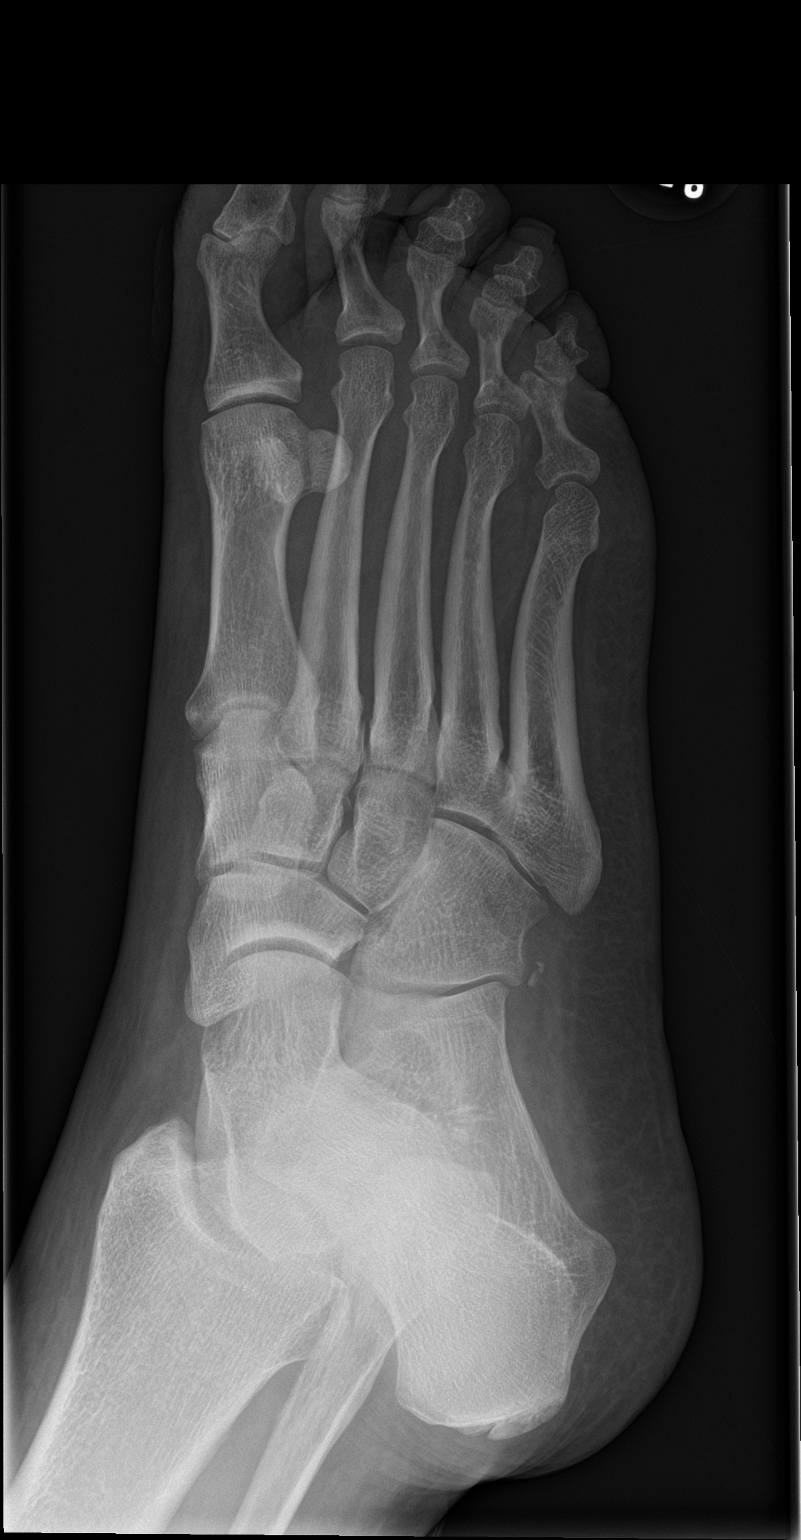

[foot lat]
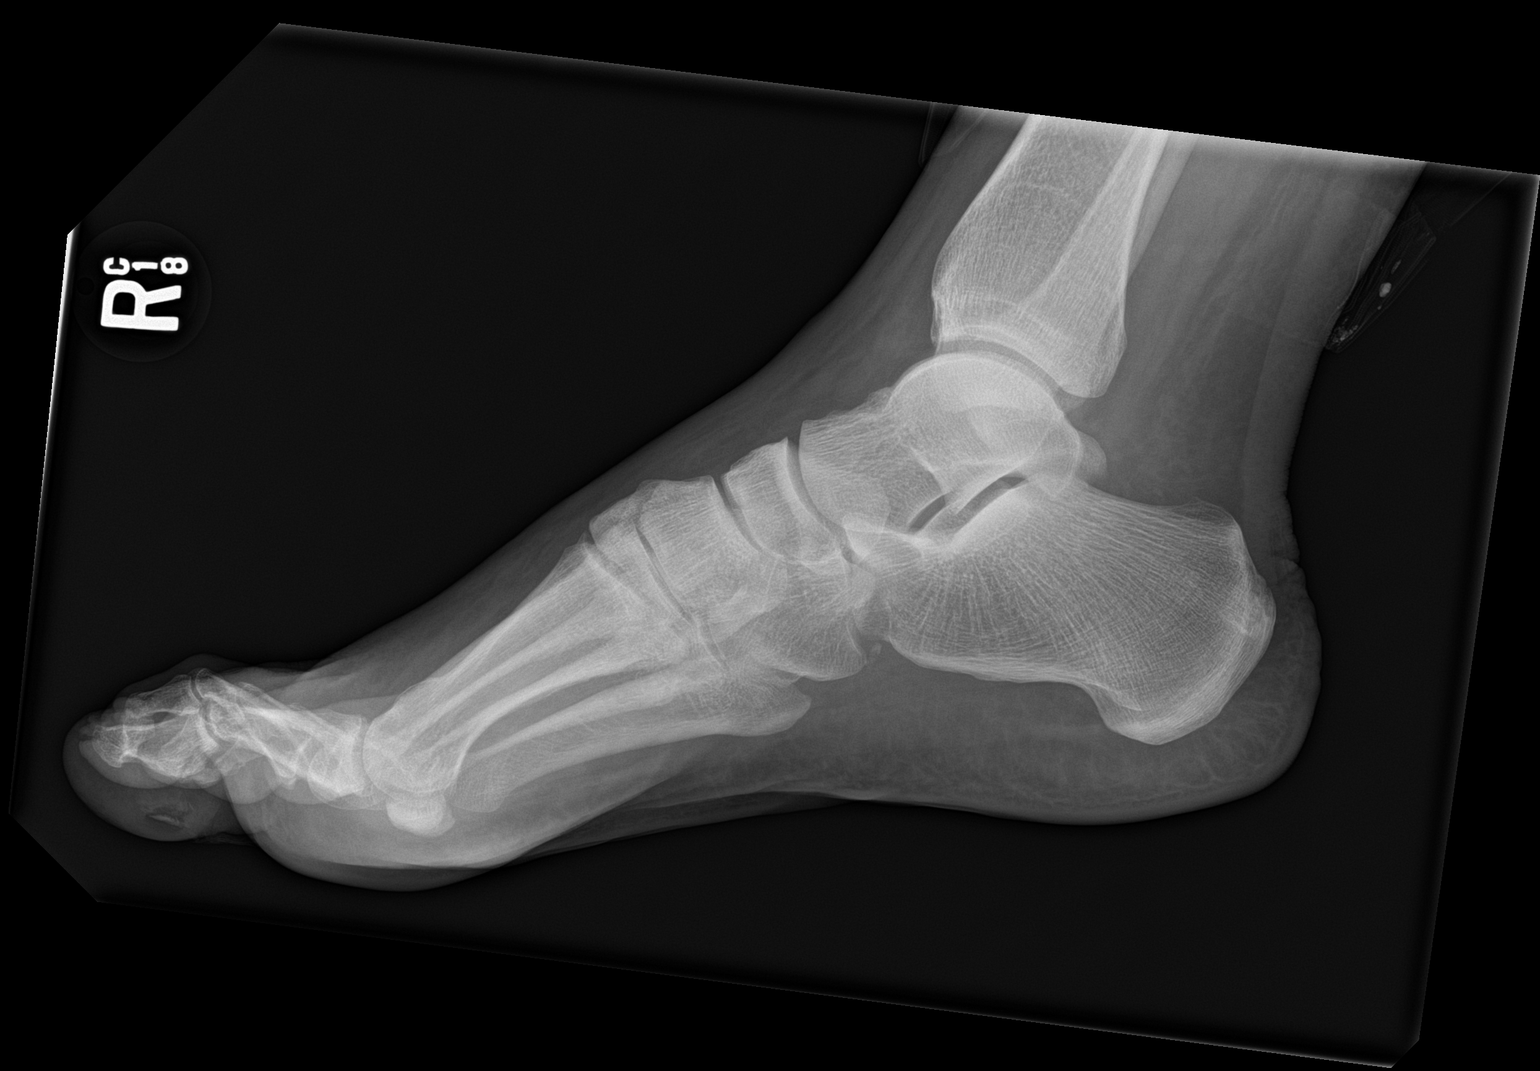

[3 of 3 positions shown; findings below may reference images not displayed]

FINDINGS: Soft tissue defect noted in the right great toe. No acute bony
abnormality. Specifically, no fracture, subluxation, or dislocation.
No radiographic changes of osteomyelitis. Joint spaces maintained.
IMPRESSION: No acute bony abnormality. No radiographic evidence of
osteomyelitis.

## 2023-05-03 ENCOUNTER — Other Ambulatory Visit: Payer: Self-pay | Admitting: Family Medicine

## 2023-05-03 ENCOUNTER — Ambulatory Visit
Admission: RE | Admit: 2023-05-03 | Discharge: 2023-05-03 | Disposition: A | Discharge status: Home / Self Care | Payer: No Typology Code available for payment source | Source: Ambulatory Visit | Attending: Family Medicine | Admitting: Family Medicine

## 2023-05-03 DIAGNOSIS — M79604 Pain in right leg: Secondary | ICD-10-CM | POA: Insufficient documentation

## 2024-03-05 DEATH — deceased
